# Patient Record
Sex: Female | Born: 1980 | Race: Black or African American | Hispanic: No | Marital: Single | Smoking: Never smoker
Health system: Southern US, Community
[De-identification: ages and names within clinical notes are randomized; demographics above are authoritative.]

## PROBLEM LIST (undated history)

## (undated) DIAGNOSIS — F419 Anxiety disorder, unspecified: Secondary | ICD-10-CM

## (undated) DIAGNOSIS — F329 Major depressive disorder, single episode, unspecified: Secondary | ICD-10-CM

## (undated) DIAGNOSIS — O1495 Unspecified pre-eclampsia, complicating the puerperium: Secondary | ICD-10-CM

## (undated) DIAGNOSIS — F32A Depression, unspecified: Secondary | ICD-10-CM

## (undated) HISTORY — PX: COLONOSCOPY: SHX174

## (undated) HISTORY — DX: Depression, unspecified: F32.A

## (undated) HISTORY — DX: Major depressive disorder, single episode, unspecified: F32.9

## (undated) HISTORY — DX: Anxiety disorder, unspecified: F41.9

## (undated) HISTORY — DX: Unspecified pre-eclampsia, complicating the puerperium: O14.95

---

## 2000-02-04 ENCOUNTER — Other Ambulatory Visit: Admission: RE | Admit: 2000-02-04 | Discharge: 2000-02-04 | Payer: Self-pay | Admitting: Gynecology

## 2000-04-28 ENCOUNTER — Inpatient Hospital Stay (HOSPITAL_COMMUNITY): Admission: AD | Admit: 2000-04-28 | Discharge: 2000-04-28 | Payer: Self-pay | Admitting: Obstetrics

## 2000-08-02 ENCOUNTER — Emergency Department (HOSPITAL_COMMUNITY): Admission: EM | Admit: 2000-08-02 | Discharge: 2000-08-02 | Payer: Self-pay | Admitting: Emergency Medicine

## 2000-10-01 ENCOUNTER — Encounter: Payer: Self-pay | Admitting: *Deleted

## 2000-10-01 ENCOUNTER — Emergency Department (HOSPITAL_COMMUNITY): Admission: EM | Admit: 2000-10-01 | Discharge: 2000-10-01 | Payer: Self-pay | Admitting: Family Medicine

## 2000-10-27 ENCOUNTER — Inpatient Hospital Stay (HOSPITAL_COMMUNITY): Admission: AD | Admit: 2000-10-27 | Discharge: 2000-10-27 | Payer: Self-pay | Admitting: Obstetrics & Gynecology

## 2000-10-27 ENCOUNTER — Inpatient Hospital Stay (HOSPITAL_COMMUNITY): Admission: AD | Admit: 2000-10-27 | Discharge: 2000-10-27 | Payer: Self-pay | Admitting: *Deleted

## 2000-10-29 ENCOUNTER — Inpatient Hospital Stay (HOSPITAL_COMMUNITY): Admission: AD | Admit: 2000-10-29 | Discharge: 2000-10-29 | Payer: Self-pay | Admitting: Obstetrics

## 2001-04-18 ENCOUNTER — Inpatient Hospital Stay (HOSPITAL_COMMUNITY): Admission: AD | Admit: 2001-04-18 | Discharge: 2001-04-18 | Payer: Self-pay | Admitting: *Deleted

## 2001-04-19 ENCOUNTER — Encounter: Payer: Self-pay | Admitting: *Deleted

## 2001-04-25 ENCOUNTER — Inpatient Hospital Stay (HOSPITAL_COMMUNITY): Admission: AD | Admit: 2001-04-25 | Discharge: 2001-04-25 | Payer: Self-pay | Admitting: *Deleted

## 2001-12-16 ENCOUNTER — Inpatient Hospital Stay (HOSPITAL_COMMUNITY): Admission: AD | Admit: 2001-12-16 | Discharge: 2001-12-16 | Payer: Self-pay | Admitting: Obstetrics

## 2001-12-18 ENCOUNTER — Inpatient Hospital Stay (HOSPITAL_COMMUNITY): Admission: AD | Admit: 2001-12-18 | Discharge: 2001-12-18 | Payer: Self-pay | Admitting: Obstetrics & Gynecology

## 2001-12-22 ENCOUNTER — Encounter: Payer: Self-pay | Admitting: *Deleted

## 2001-12-22 ENCOUNTER — Observation Stay (HOSPITAL_COMMUNITY): Admission: AD | Admit: 2001-12-22 | Discharge: 2001-12-23 | Payer: Self-pay | Admitting: *Deleted

## 2001-12-29 ENCOUNTER — Observation Stay (HOSPITAL_COMMUNITY): Admission: AD | Admit: 2001-12-29 | Discharge: 2001-12-30 | Payer: Self-pay | Admitting: *Deleted

## 2002-01-05 ENCOUNTER — Inpatient Hospital Stay (HOSPITAL_COMMUNITY): Admission: AD | Admit: 2002-01-05 | Discharge: 2002-01-07 | Payer: Self-pay | Admitting: *Deleted

## 2002-01-12 ENCOUNTER — Inpatient Hospital Stay (HOSPITAL_COMMUNITY): Admission: AD | Admit: 2002-01-12 | Discharge: 2002-01-14 | Payer: Self-pay | Admitting: *Deleted

## 2002-01-21 ENCOUNTER — Encounter: Admission: RE | Admit: 2002-01-21 | Discharge: 2002-01-21 | Payer: Self-pay | Admitting: Family Medicine

## 2002-03-02 ENCOUNTER — Encounter: Admission: RE | Admit: 2002-03-02 | Discharge: 2002-03-02 | Payer: Self-pay | Admitting: Family Medicine

## 2002-03-17 ENCOUNTER — Ambulatory Visit (HOSPITAL_COMMUNITY): Admission: RE | Admit: 2002-03-17 | Discharge: 2002-03-17 | Payer: Self-pay | Admitting: Internal Medicine

## 2002-04-05 ENCOUNTER — Encounter: Admission: RE | Admit: 2002-04-05 | Discharge: 2002-04-05 | Payer: Self-pay | Admitting: Family Medicine

## 2002-05-06 ENCOUNTER — Encounter: Admission: RE | Admit: 2002-05-06 | Discharge: 2002-05-06 | Payer: Self-pay | Admitting: Family Medicine

## 2002-05-31 ENCOUNTER — Encounter: Admission: RE | Admit: 2002-05-31 | Discharge: 2002-05-31 | Payer: Self-pay | Admitting: Family Medicine

## 2002-06-09 ENCOUNTER — Encounter: Admission: RE | Admit: 2002-06-09 | Discharge: 2002-06-09 | Payer: Self-pay | Admitting: Family Medicine

## 2002-06-29 ENCOUNTER — Encounter: Admission: RE | Admit: 2002-06-29 | Discharge: 2002-06-29 | Payer: Self-pay | Admitting: Sports Medicine

## 2002-07-12 ENCOUNTER — Inpatient Hospital Stay (HOSPITAL_COMMUNITY): Admission: AD | Admit: 2002-07-12 | Discharge: 2002-07-12 | Payer: Self-pay | Admitting: Family Medicine

## 2002-07-16 ENCOUNTER — Encounter: Admission: RE | Admit: 2002-07-16 | Discharge: 2002-07-16 | Payer: Self-pay | Admitting: Family Medicine

## 2002-07-29 ENCOUNTER — Encounter: Admission: RE | Admit: 2002-07-29 | Discharge: 2002-07-29 | Payer: Self-pay | Admitting: Family Medicine

## 2002-08-10 ENCOUNTER — Encounter: Admission: RE | Admit: 2002-08-10 | Discharge: 2002-08-10 | Payer: Self-pay | Admitting: Sports Medicine

## 2002-08-17 ENCOUNTER — Inpatient Hospital Stay (HOSPITAL_COMMUNITY): Admission: AD | Admit: 2002-08-17 | Discharge: 2002-08-20 | Payer: Self-pay | Admitting: *Deleted

## 2002-08-23 ENCOUNTER — Encounter: Admission: RE | Admit: 2002-08-23 | Discharge: 2002-08-23 | Payer: Self-pay | Admitting: Sports Medicine

## 2002-10-04 ENCOUNTER — Other Ambulatory Visit: Admission: RE | Admit: 2002-10-04 | Discharge: 2002-10-04 | Payer: Self-pay | Admitting: Family Medicine

## 2002-10-04 ENCOUNTER — Encounter: Admission: RE | Admit: 2002-10-04 | Discharge: 2002-10-04 | Payer: Self-pay | Admitting: Family Medicine

## 2002-12-27 ENCOUNTER — Encounter: Payer: Self-pay | Admitting: Emergency Medicine

## 2002-12-27 ENCOUNTER — Emergency Department (HOSPITAL_COMMUNITY): Admission: EM | Admit: 2002-12-27 | Discharge: 2002-12-27 | Payer: Self-pay | Admitting: Emergency Medicine

## 2003-05-26 ENCOUNTER — Encounter: Admission: RE | Admit: 2003-05-26 | Discharge: 2003-05-26 | Payer: Self-pay | Admitting: Sports Medicine

## 2003-05-31 ENCOUNTER — Encounter: Admission: RE | Admit: 2003-05-31 | Discharge: 2003-05-31 | Payer: Self-pay | Admitting: Sports Medicine

## 2003-05-31 ENCOUNTER — Encounter: Payer: Self-pay | Admitting: Sports Medicine

## 2003-05-31 ENCOUNTER — Encounter: Admission: RE | Admit: 2003-05-31 | Discharge: 2003-05-31 | Payer: Self-pay | Admitting: Family Medicine

## 2003-08-16 ENCOUNTER — Encounter: Admission: RE | Admit: 2003-08-16 | Discharge: 2003-08-16 | Payer: Self-pay | Admitting: Family Medicine

## 2003-10-07 ENCOUNTER — Other Ambulatory Visit: Admission: RE | Admit: 2003-10-07 | Discharge: 2003-10-07 | Payer: Self-pay | Admitting: Internal Medicine

## 2003-10-07 ENCOUNTER — Encounter: Admission: RE | Admit: 2003-10-07 | Discharge: 2003-10-07 | Payer: Self-pay | Admitting: Family Medicine

## 2003-11-03 ENCOUNTER — Encounter: Admission: RE | Admit: 2003-11-03 | Discharge: 2003-11-03 | Payer: Self-pay | Admitting: Family Medicine

## 2003-12-07 ENCOUNTER — Encounter: Admission: RE | Admit: 2003-12-07 | Discharge: 2003-12-07 | Payer: Self-pay | Admitting: Sports Medicine

## 2004-01-24 ENCOUNTER — Encounter: Admission: RE | Admit: 2004-01-24 | Discharge: 2004-01-24 | Payer: Self-pay | Admitting: Family Medicine

## 2004-05-09 ENCOUNTER — Encounter: Admission: RE | Admit: 2004-05-09 | Discharge: 2004-05-09 | Payer: Self-pay | Admitting: Family Medicine

## 2004-08-21 ENCOUNTER — Emergency Department (HOSPITAL_COMMUNITY): Admission: EM | Admit: 2004-08-21 | Discharge: 2004-08-21 | Payer: Self-pay | Admitting: Emergency Medicine

## 2004-09-20 ENCOUNTER — Ambulatory Visit: Payer: Self-pay | Admitting: Family Medicine

## 2004-12-11 ENCOUNTER — Ambulatory Visit: Payer: Self-pay | Admitting: Family Medicine

## 2005-02-27 ENCOUNTER — Ambulatory Visit: Payer: Self-pay | Admitting: Family Medicine

## 2005-06-06 ENCOUNTER — Ambulatory Visit: Payer: Self-pay | Admitting: Family Medicine

## 2005-09-25 ENCOUNTER — Encounter (INDEPENDENT_AMBULATORY_CARE_PROVIDER_SITE_OTHER): Payer: Self-pay | Admitting: *Deleted

## 2005-10-23 ENCOUNTER — Ambulatory Visit: Payer: Self-pay | Admitting: Family Medicine

## 2006-01-09 ENCOUNTER — Ambulatory Visit: Payer: Self-pay | Admitting: Family Medicine

## 2006-07-02 ENCOUNTER — Emergency Department (HOSPITAL_COMMUNITY): Admission: EM | Admit: 2006-07-02 | Discharge: 2006-07-02 | Payer: Self-pay | Admitting: Family Medicine

## 2007-01-23 ENCOUNTER — Encounter (INDEPENDENT_AMBULATORY_CARE_PROVIDER_SITE_OTHER): Payer: Self-pay | Admitting: *Deleted

## 2007-04-26 ENCOUNTER — Emergency Department (HOSPITAL_COMMUNITY): Admission: EM | Admit: 2007-04-26 | Discharge: 2007-04-26 | Payer: Self-pay | Admitting: Family Medicine

## 2007-04-30 ENCOUNTER — Ambulatory Visit (HOSPITAL_COMMUNITY): Admission: RE | Admit: 2007-04-30 | Discharge: 2007-04-30 | Payer: Self-pay | Admitting: Family Medicine

## 2007-05-05 ENCOUNTER — Encounter (INDEPENDENT_AMBULATORY_CARE_PROVIDER_SITE_OTHER): Payer: Self-pay | Admitting: Family Medicine

## 2007-05-05 ENCOUNTER — Ambulatory Visit: Payer: Self-pay | Admitting: Sports Medicine

## 2007-05-05 DIAGNOSIS — N760 Acute vaginitis: Secondary | ICD-10-CM | POA: Insufficient documentation

## 2007-05-05 LAB — CONVERTED CEMR LAB: Whiff Test: POSITIVE

## 2007-05-06 ENCOUNTER — Encounter (INDEPENDENT_AMBULATORY_CARE_PROVIDER_SITE_OTHER): Payer: Self-pay | Admitting: Family Medicine

## 2007-05-06 LAB — CONVERTED CEMR LAB: GC Probe Amp, Genital: NEGATIVE

## 2007-05-09 ENCOUNTER — Encounter (INDEPENDENT_AMBULATORY_CARE_PROVIDER_SITE_OTHER): Payer: Self-pay | Admitting: Family Medicine

## 2007-05-18 ENCOUNTER — Telehealth: Payer: Self-pay | Admitting: *Deleted

## 2007-10-09 ENCOUNTER — Emergency Department (HOSPITAL_COMMUNITY): Admission: EM | Admit: 2007-10-09 | Discharge: 2007-10-09 | Payer: Self-pay | Admitting: Emergency Medicine

## 2007-10-11 ENCOUNTER — Emergency Department (HOSPITAL_COMMUNITY): Admission: EM | Admit: 2007-10-11 | Discharge: 2007-10-11 | Payer: Self-pay | Admitting: Emergency Medicine

## 2007-10-13 ENCOUNTER — Encounter: Admission: RE | Admit: 2007-10-13 | Discharge: 2007-10-22 | Payer: Self-pay | Admitting: Orthopedic Surgery

## 2007-11-03 ENCOUNTER — Encounter: Payer: Self-pay | Admitting: Family Medicine

## 2007-11-03 ENCOUNTER — Encounter (INDEPENDENT_AMBULATORY_CARE_PROVIDER_SITE_OTHER): Payer: Self-pay | Admitting: Family Medicine

## 2007-11-03 ENCOUNTER — Other Ambulatory Visit: Admission: RE | Admit: 2007-11-03 | Discharge: 2007-11-03 | Payer: Self-pay | Admitting: Family Medicine

## 2007-11-03 ENCOUNTER — Ambulatory Visit: Payer: Self-pay | Admitting: Family Medicine

## 2007-11-03 LAB — CONVERTED CEMR LAB
Basophils Absolute: 0 10*3/uL (ref 0.0–0.1)
GC Probe Amp, Genital: NEGATIVE
Hemoglobin: 12.2 g/dL (ref 12.0–15.0)
Hepatitis B Surface Ag: NEGATIVE
Lymphocytes Relative: 46 % (ref 12–46)
Lymphs Abs: 2.1 10*3/uL (ref 0.7–4.0)
MCV: 90.5 fL (ref 78.0–100.0)
Neutro Abs: 2.1 10*3/uL (ref 1.7–7.7)
Pap Smear: NORMAL
Platelets: 213 10*3/uL (ref 150–400)
RBC: 4.12 M/uL (ref 3.87–5.11)
RDW: 13.7 % (ref 11.5–15.5)
Rh Type: POSITIVE
Rubella: 53.4 intl units/mL — ABNORMAL HIGH

## 2007-11-04 ENCOUNTER — Encounter: Payer: Self-pay | Admitting: Family Medicine

## 2007-11-05 ENCOUNTER — Encounter: Payer: Self-pay | Admitting: Family Medicine

## 2007-11-05 ENCOUNTER — Ambulatory Visit: Payer: Self-pay | Admitting: Family Medicine

## 2007-11-05 LAB — CONVERTED CEMR LAB: hCG, Beta Chain, Quant, S: 4016.7 milliintl units/mL

## 2007-11-09 ENCOUNTER — Telehealth: Payer: Self-pay | Admitting: Family Medicine

## 2007-11-13 ENCOUNTER — Inpatient Hospital Stay (HOSPITAL_COMMUNITY): Admission: AD | Admit: 2007-11-13 | Discharge: 2007-11-13 | Payer: Self-pay | Admitting: Obstetrics and Gynecology

## 2007-11-21 ENCOUNTER — Inpatient Hospital Stay (HOSPITAL_COMMUNITY): Admission: AD | Admit: 2007-11-21 | Discharge: 2007-11-21 | Payer: Self-pay | Admitting: Gynecology

## 2007-11-30 ENCOUNTER — Encounter: Payer: Self-pay | Admitting: Family Medicine

## 2007-11-30 ENCOUNTER — Ambulatory Visit: Payer: Self-pay | Admitting: Family Medicine

## 2007-11-30 ENCOUNTER — Observation Stay (HOSPITAL_COMMUNITY): Admission: AD | Admit: 2007-11-30 | Discharge: 2007-11-30 | Payer: Self-pay | Admitting: Family Medicine

## 2007-11-30 LAB — CONVERTED CEMR LAB
Glucose, Urine, Semiquant: NEGATIVE
Protein, U semiquant: NEGATIVE

## 2007-12-18 ENCOUNTER — Inpatient Hospital Stay (HOSPITAL_COMMUNITY): Admission: AD | Admit: 2007-12-18 | Discharge: 2007-12-18 | Payer: Self-pay | Admitting: Obstetrics & Gynecology

## 2007-12-21 ENCOUNTER — Encounter: Payer: Self-pay | Admitting: Family Medicine

## 2007-12-24 ENCOUNTER — Inpatient Hospital Stay (HOSPITAL_COMMUNITY): Admission: AD | Admit: 2007-12-24 | Discharge: 2007-12-27 | Payer: Self-pay | Admitting: Obstetrics & Gynecology

## 2008-01-24 ENCOUNTER — Inpatient Hospital Stay (HOSPITAL_COMMUNITY): Admission: AD | Admit: 2008-01-24 | Discharge: 2008-01-24 | Payer: Self-pay | Admitting: Obstetrics

## 2008-01-27 ENCOUNTER — Inpatient Hospital Stay (HOSPITAL_COMMUNITY): Admission: AD | Admit: 2008-01-27 | Discharge: 2008-01-28 | Payer: Self-pay | Admitting: Obstetrics & Gynecology

## 2008-02-03 ENCOUNTER — Ambulatory Visit (HOSPITAL_COMMUNITY): Admission: RE | Admit: 2008-02-03 | Discharge: 2008-02-03 | Payer: Self-pay | Admitting: Obstetrics

## 2008-04-10 ENCOUNTER — Inpatient Hospital Stay (HOSPITAL_COMMUNITY): Admission: AD | Admit: 2008-04-10 | Discharge: 2008-04-10 | Payer: Self-pay | Admitting: Obstetrics

## 2008-06-17 ENCOUNTER — Inpatient Hospital Stay (HOSPITAL_COMMUNITY): Admission: AD | Admit: 2008-06-17 | Discharge: 2008-06-17 | Payer: Self-pay | Admitting: Psychiatry

## 2008-06-20 ENCOUNTER — Inpatient Hospital Stay (HOSPITAL_COMMUNITY): Admission: AD | Admit: 2008-06-20 | Discharge: 2008-06-20 | Payer: Self-pay | Admitting: Psychiatry

## 2008-06-23 ENCOUNTER — Inpatient Hospital Stay (HOSPITAL_COMMUNITY): Admission: AD | Admit: 2008-06-23 | Discharge: 2008-06-26 | Payer: Self-pay | Admitting: Obstetrics

## 2008-06-29 ENCOUNTER — Inpatient Hospital Stay (HOSPITAL_COMMUNITY): Admission: AD | Admit: 2008-06-29 | Discharge: 2008-07-03 | Payer: Self-pay | Admitting: Obstetrics

## 2008-06-29 ENCOUNTER — Ambulatory Visit: Payer: Self-pay | Admitting: Cardiology

## 2008-07-01 ENCOUNTER — Encounter: Payer: Self-pay | Admitting: Obstetrics & Gynecology

## 2009-05-24 ENCOUNTER — Encounter: Payer: Self-pay | Admitting: Family Medicine
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
## 2009-09-21 ENCOUNTER — Ambulatory Visit: Payer: Self-pay | Admitting: Family Medicine

## 2009-09-21 ENCOUNTER — Encounter: Payer: Self-pay | Admitting: Family Medicine

## 2009-09-21 ENCOUNTER — Telehealth: Payer: Self-pay | Admitting: Family Medicine

## 2009-09-21 ENCOUNTER — Other Ambulatory Visit: Admission: RE | Admit: 2009-09-21 | Discharge: 2009-09-21 | Payer: Self-pay | Admitting: Family Medicine

## 2009-09-21 LAB — CONVERTED CEMR LAB
Chlamydia, DNA Probe: NEGATIVE
HCV Ab: NEGATIVE

## 2009-09-22 ENCOUNTER — Encounter: Payer: Self-pay | Admitting: Family Medicine

## 2011-01-13 ENCOUNTER — Encounter: Payer: Self-pay | Admitting: *Deleted

## 2011-02-05 ENCOUNTER — Inpatient Hospital Stay (HOSPITAL_COMMUNITY)
Admission: AD | Admit: 2011-02-05 | Discharge: 2011-02-05 | Disposition: A | Discharge status: Home / Self Care | Payer: Self-pay | Source: Ambulatory Visit | Attending: Obstetrics & Gynecology | Admitting: Obstetrics & Gynecology

## 2011-02-05 ENCOUNTER — Inpatient Hospital Stay (HOSPITAL_COMMUNITY): Payer: Self-pay

## 2011-02-05 DIAGNOSIS — R52 Pain, unspecified: Secondary | ICD-10-CM

## 2011-02-05 DIAGNOSIS — O209 Hemorrhage in early pregnancy, unspecified: Secondary | ICD-10-CM

## 2011-02-05 LAB — URINALYSIS, ROUTINE W REFLEX MICROSCOPIC
Glucose, UA: NEGATIVE mg/dL
Ketones, ur: NEGATIVE mg/dL
Leukocytes, UA: NEGATIVE
Nitrite: NEGATIVE
pH: 5.5 (ref 5.0–8.0)

## 2011-02-05 LAB — CBC
HCT: 38.6 % (ref 36.0–46.0)
MCHC: 33.2 g/dL (ref 30.0–36.0)
MCV: 87.7 fL (ref 78.0–100.0)
Platelets: 216 10*3/uL (ref 150–400)
RDW: 12.7 % (ref 11.5–15.5)

## 2011-02-05 LAB — URINE MICROSCOPIC-ADD ON

## 2011-02-05 LAB — POCT PREGNANCY, URINE

## 2011-02-05 LAB — WET PREP, GENITAL: Trich, Wet Prep: NONE SEEN

## 2011-02-06 LAB — GC/CHLAMYDIA PROBE AMP, GENITAL: Chlamydia, DNA Probe: NEGATIVE

## 2011-02-07 ENCOUNTER — Inpatient Hospital Stay (HOSPITAL_COMMUNITY)
Admission: AD | Admit: 2011-02-07 | Discharge: 2011-02-07 | Disposition: A | Discharge status: Home / Self Care | Payer: Self-pay | Source: Ambulatory Visit | Attending: Family Medicine | Admitting: Family Medicine

## 2011-02-07 DIAGNOSIS — O3680X0 Pregnancy with inconclusive fetal viability, not applicable or unspecified: Secondary | ICD-10-CM

## 2011-02-07 DIAGNOSIS — O209 Hemorrhage in early pregnancy, unspecified: Secondary | ICD-10-CM | POA: Insufficient documentation

## 2011-02-07 LAB — HCG, QUANTITATIVE, PREGNANCY: hCG, Beta Chain, Quant, S: 517 m[IU]/mL — ABNORMAL HIGH (ref <5)

## 2011-02-14 ENCOUNTER — Ambulatory Visit (HOSPITAL_COMMUNITY)
Admit: 2011-02-14 | Discharge: 2011-02-14 | Disposition: A | Discharge status: Home / Self Care | Payer: Self-pay | Attending: Family Medicine | Admitting: Family Medicine

## 2011-02-14 ENCOUNTER — Inpatient Hospital Stay (HOSPITAL_COMMUNITY)
Admission: AD | Admit: 2011-02-14 | Discharge: 2011-02-14 | Disposition: A | Discharge status: Home / Self Care | Payer: Self-pay | Source: Ambulatory Visit | Attending: Obstetrics and Gynecology | Admitting: Obstetrics and Gynecology

## 2011-02-14 DIAGNOSIS — R109 Unspecified abdominal pain: Secondary | ICD-10-CM | POA: Insufficient documentation

## 2011-02-14 DIAGNOSIS — O99891 Other specified diseases and conditions complicating pregnancy: Secondary | ICD-10-CM | POA: Insufficient documentation

## 2011-02-14 DIAGNOSIS — O209 Hemorrhage in early pregnancy, unspecified: Secondary | ICD-10-CM | POA: Insufficient documentation

## 2011-02-14 DIAGNOSIS — O3680X0 Pregnancy with inconclusive fetal viability, not applicable or unspecified: Secondary | ICD-10-CM

## 2011-02-14 DIAGNOSIS — Z3689 Encounter for other specified antenatal screening: Secondary | ICD-10-CM | POA: Insufficient documentation

## 2011-02-14 LAB — HCG, QUANTITATIVE, PREGNANCY: hCG, Beta Chain, Quant, S: 10319 m[IU]/mL — ABNORMAL HIGH (ref <5)

## 2011-02-25 ENCOUNTER — Ambulatory Visit (HOSPITAL_COMMUNITY): Payer: Self-pay | Attending: Obstetrics and Gynecology

## 2011-04-12 NOTE — Discharge Summary (Signed)
NAME:  Leah Dominguez, Leah Dominguez               ACCOUNT NO.:  [id number]   MEDICAL RECORD NO.:  [id number]          PATIENT TYPE:  INP   LOCATION:  9307                          FACILITY:  [facility]   PHYSICIAN:  Roseanna Rainbow, M.D.DATE OF BIRTH:  12-30-1980   DATE OF ADMISSION:  12/24/2007  DATE OF DISCHARGE:  12/27/2007                               DISCHARGE SUMMARY   CHIEF COMPLAINT:  The patient is a 30 year old para 1, with an estimated  date of confinement of July 03, 2008, by last menstrual period,  complaining of nausea and vomiting.   HISTORY OF PRESENT ILLNESS:  She had presented to [facility]  several weeks ago with similar complaints.  She was started on Zofran.  Her nausea and vomiting was controlled with the Zofran; however, the  patient did not have her prescription refilled.   PAST SURGICAL HISTORY:  She denies.   PAST MEDICAL HISTORY:  She denies.   MEDICATIONS:  1. Zofran.  2. Tylenol.   ALLERGIES:  No known drug allergies.   SOCIAL HISTORY:  She is single.  She denies any tobacco, ethanol, or  drug use.   PHYSICAL EXAMINATION:  VITAL SIGNS:  Stable and afebrile.  LUNGS:  Clear to auscultation bilaterally.  HEART:  Regular rate and rhythm.  ABDOMEN:  Nontender.   ASSESSMENT AND PLAN:  Intrauterine pregnancy at 25-13 weeks' gestation  with hyperemesis gravidarum.   PLAN:  1. Admission.  2. IV hydration.  3. Supportive management.   HOSPITAL COURSE:  The patient was admitted.  She was given Zofran.  Her  symptoms improved.  She was tolerating a regular diet, and she was  discharged to home on December 27, 2007.   DISCHARGE DIAGNOSES:  1. Intrauterine pregnancy, early second trimester.  2. Hyperemesis gravidarum.   CONDITION:  Stable.   DIET:  Regular bland diet.   ACTIVITY:  Modified bedrest.   MEDICATIONS:  Included Zofran.   DISPOSITION:  The patient was to follow up in the office in 1 week.      Roseanna Rainbow, M.D.  Electronically Signed     LAJ/MEDQ  D:  02/26/2008  T:  02/27/2008  Job:  [id number]

## 2011-04-12 NOTE — Discharge Summary (Signed)
[facility]  Patient:    ABIHA, LUKEHART Visit Number: [id number] MRN: [id number]          Service Type: Attending:  Conni Elliot, M.D. Dictated by:   Ed Blalock. Burnadette Peter, M.D. Adm. Date:  01/05/02 Disc. Date: 01/07/02                             Discharge Summary  HISTORY OF PRESENT ILLNESS:  The patient is a 30 year old, G5, P4, who at 8-1/7 weeks who was seen numerous times prior to admission with nausea and vomiting.  She has been vomiting since the early day of her admission, has used p.o. Phenergan without relief.  She had Phenergan suppositories, but she did not use these.  She has vomited several times while in maternity admissions units.  MEDICATIONS:  Prenatal vitamins.  ALLERGIES:  No known drug allergies.  PAST OBSTETRICAL HISTORY: 1. TAB x3. 2. SAB x1.  PAST GYNECOLOGICAL HISTORY:  History of chlamydia.  PAST MEDICAL HISTORY:  Denies any prior problems or surgeries.  SOCIAL HISTORY:  Denies tobacco, alcohol, or drug use.  No hospitalizations.  PHYSICAL EXAMINATION:  VITAL SIGNS:  Stable.  GENERAL:  Well-developed, well-nourished black female who appears ill.  HEENT:  Normocephalic, oropharynx was mildly dry.  EXTREMITIES:  Good range of motion, no edema.  SKIN:  Warm and dry.  LABORATORY DATA:  UA shows 80 ketones, on subsequent repeat labs as well.  HOSPITAL COURSE:  The patient was admitted initially overnight for IV rehydration.  She after day #1, still continued to have ketones and some nausea.  She was kept for a further day on IV fluids and Phenergan.  She eventually improved, and was able to tolerate p.o.  Ketonuria improved, and she was subsequently discharged home.  ACTIVITY:  Ad lib.  DIET:  Clear liquids, advance as tolerated.  DISCHARGE MEDICATIONS: 1. B6. 2. ______.  FOLLOWUP: 1. She is to be seen in the MAU approximately 5 days after discharge for    electrolytes check. 2. She is to follow  up [facility] in one week.  CONDITION ON DISCHARGE:  Improved.  DISCHARGE DIAGNOSES: 1. Intrauterine pregnancy at 8 weeks. 2. Hyperemesis, improved. Dictated by:   Ed Blalock. Burnadette Peter, M.D. Attending:  Conni Elliot, M.D. DD:  03/02/02 TD:  03/02/02 Job: [id number]

## 2011-04-12 NOTE — Discharge Summary (Signed)
[facility]  Patient:    Leah Dominguez, Leah Dominguez Visit Number: [id number] MRN: [id number]          Service Type: Attending:  Denton Brick M.D. Dictated by:   Vear Clock, M.D. Adm. Date:  01/12/02 Disc. Date: 01/14/02   CC:         [facility], Attn: Dr. Ophelia Charter   Discharge Summary  DISCHARGE DIAGNOSES:          1. Pregnancy.                               2. Hyperemesis.  DISCHARGE MEDICATIONS:        1. Vitamin B6 50 mg p.o. b.i.d.                               2. Unisom 12.5 mg 1/2 tab p.o. q.a.m. and                                  q.noon, 1 tab p.o. q.h.s.                               3. Phenergan, take as directed previously.                               4. Prenatal vitamins one p.o. q.d.  FOLLOW-UP:                    The patient is to follow up with Dr. Jonah Blue at [facility] on Thursday, January 21, 2002 at 3 p.m.  PROCEDURES AND DIAGNOSTIC STUDIES:                      None.  CONSULTANTS:                  None.  ADMISSION HISTORY AND PHYSICAL:                     [name] is a 30 year old, G5, P0-0-4-0 at 65 and 4 weeks who presented with hyperemesis on January 11, 2002.  The patient was admitted and this was her second admission for hyperemesis during this pregnancy.  HOSPITAL COURSE:  #1 - HYPEREMESIS:  The patient was started on IV fluids and encouraged to take p.o. and cleared for urine ketones.  She was started on doxylamine and vitamin B6 with p.r.n. Phenergan and eventually was able to tolerate p.o. again.  The patient was discharged to home with [facility] ordered to do q.d. visits to assess hydration status and urine ketones times one week.  She was discharged with B6, Unisom and Phenergan.  She did clear her nausea and vomiting and was able to tolerate p.o. on the day of discharge, which was January 14, 2002.  DISCHARGE LABORATORY DATA:    Sodium 138,  potassium 3.7, chloride 107, CO2 22, glucose 91, BUN 3, creatinine 0.5, calcium 8.5.  Urine with trace ketones.  UA on admission shows trace hemoglobin, greater than 80 ketones, trace LE but was normal at discharge.  Urine culture showed greater than 100,000 colonies of multiple species consistent with contamination.  The patient was discharged home without further incident. Dictated by:   Vear Clock, M.D. Attending:  Denton Brick M.D. DD:  03/25/02 TD:  03/29/02 Job: [id number]

## 2011-04-12 NOTE — Discharge Summary (Signed)
NAME:  Leah Dominguez, Leah Dominguez               ACCOUNT NO.:  [id number]   MEDICAL RECORD NO.:  [id number]          PATIENT TYPE:  INP   LOCATION:  9320                          FACILITY:  [facility]   PHYSICIAN:  Charles A. Clearance Coots, M.D.DATE OF BIRTH:  1981/05/07   DATE OF ADMISSION:  06/29/2008  DATE OF DISCHARGE:  07/03/2008                               DISCHARGE SUMMARY   ADMITTING DIAGNOSIS:  Postpartum preeclampsia.   DISCHARGE DIAGNOSES:  Postpartum preeclampsia improved after IV  magnesium sulfate, prophylaxis for seizure management, and  antihypertensive therapy.   The patient discharged home much improved in good condition.   REASON FOR ADMISSION:  A 30 year old black female postpartum status post  normal spontaneous vaginal delivery on June 24, 2008, presents with  shortness of breath and chest pain and swelling of her feet.   PAST MEDICAL HISTORY/SURGERY:  None.   ILLNESSES:  None.   MEDICATIONS:  Prenatal vitamins.   ALLERGIES:  LATEX.   SOCIAL HISTORY:  Single.  Negative tobacco, alcohol, or recreational  drug use.   PHYSICAL EXAMINATION:  VITAL SIGNS:  She is afebrile, blood pressure  170/106.  LUNGS:  Clear to auscultation bilaterally.  HEART:  Regular rate and rhythm.  ABDOMEN:  Soft and nontender.   ADMITTING LABORATORY DATA:  Hemoglobin 10, hematocrit 31, white blood  cell count 5400, and platelets 207,000.  Basic metabolic panel was  within normal limits.  SGOT was 41.  Uric acid was 5.  LDH was 149.   HOSPITAL COURSE:  The patient was admitted and started on magnesium  sulfate seizure prophylaxis.  Chest x-ray revealed mild pulmonary edema  changes.  The patient was given Lasix IV and responded quite well with  good diuresis.  Labetalol was also given for blood pressure management  and she responded well to labetalol.  Norvasc was added to her blood  pressure regimen, which was able to maintain good blood pressure control  on the combination of labetalol and Norvasc.   The patient maintain good  blood pressure control on Norvasc 10 mg p.o. daily and labetalol 200 mg  p.o. twice a day after magnesium sulfate was discontinued on hospital  day #2.  She was discharged home on hospital day #4 on the same  antihypertensive combination.   DISCHARGE DISPOSITION:  Medications, labetalol 200 mg p.o. twice a day,  Norvasc 10 mg p.o. daily.  Continue prenatal vitamins.  Routine written  instructions were given for discharge with preeclampsia precautions.  The patient is to call the office for a followup appointment in 1 week  for blood pressure check.      Charles A. Clearance Coots, M.D.  Electronically Signed     CAH/MEDQ  D:  07/22/2008  T:  07/23/2008  Job:  [id number]

## 2011-08-12 ENCOUNTER — Inpatient Hospital Stay (HOSPITAL_COMMUNITY)
Admission: AD | Admit: 2011-08-12 | Discharge: 2011-08-12 | Disposition: A | Discharge status: Home / Self Care | Payer: Self-pay | Source: Ambulatory Visit | Attending: Obstetrics & Gynecology | Admitting: Obstetrics & Gynecology

## 2011-08-12 ENCOUNTER — Encounter (HOSPITAL_COMMUNITY): Payer: Self-pay

## 2011-08-12 DIAGNOSIS — M545 Low back pain, unspecified: Secondary | ICD-10-CM | POA: Insufficient documentation

## 2011-08-12 DIAGNOSIS — S335XXA Sprain of ligaments of lumbar spine, initial encounter: Secondary | ICD-10-CM

## 2011-08-12 LAB — POCT PREGNANCY, URINE: Preg Test, Ur: POSITIVE

## 2011-08-12 LAB — URINALYSIS, ROUTINE W REFLEX MICROSCOPIC
Nitrite: NEGATIVE
Protein, ur: NEGATIVE mg/dL
Specific Gravity, Urine: >1.03 — ABNORMAL HIGH (ref 1.005–1.030)
Urobilinogen, UA: 1 mg/dL (ref 0.0–1.0)

## 2011-08-12 LAB — URINE MICROSCOPIC-ADD ON

## 2011-08-12 MED ORDER — COMPLETENATE 29-1 MG PO CHEW: 1.0000 | CHEWABLE_TABLET | Freq: Every day | ORAL | Status: DC

## 2011-08-12 NOTE — Progress Notes (Signed)
Pt states she has had lower back pain more on the right side since this am. Is taking BCP's but is afraid of being pregnant.

## 2011-08-14 LAB — URINALYSIS, ROUTINE W REFLEX MICROSCOPIC
Glucose, UA: NEGATIVE
Ketones, ur: 40 — AB
Nitrite: NEGATIVE
Specific Gravity, Urine: 1.025
pH: 6.5

## 2011-08-14 LAB — URINE MICROSCOPIC-ADD ON

## 2011-08-15 LAB — URINALYSIS, ROUTINE W REFLEX MICROSCOPIC
Bilirubin Urine: NEGATIVE
Bilirubin Urine: NEGATIVE
Glucose, UA: NEGATIVE
Hgb urine dipstick: NEGATIVE
Hgb urine dipstick: NEGATIVE
Ketones, ur: NEGATIVE
Nitrite: NEGATIVE
Protein, ur: NEGATIVE
Protein, ur: NEGATIVE
Specific Gravity, Urine: >1.03 — ABNORMAL HIGH
Urobilinogen, UA: 1
Urobilinogen, UA: 1

## 2011-08-15 LAB — CBC
MCHC: 35
MCV: 89.7
Platelets: 190
RDW: 12.9

## 2011-08-15 LAB — COMPREHENSIVE METABOLIC PANEL
ALT: <8
AST: 11
Albumin: 2.7 — ABNORMAL LOW
CO2: 21
Calcium: 8.8
Creatinine, Ser: 0.47
GFR calc Af Amer: >60
GFR calc non Af Amer: >60
Sodium: 133 — ABNORMAL LOW
Total Protein: 5.5 — ABNORMAL LOW

## 2011-08-19 ENCOUNTER — Inpatient Hospital Stay (HOSPITAL_COMMUNITY)
Admission: AD | Admit: 2011-08-19 | Discharge: 2011-08-19 | Disposition: A | Discharge status: Home / Self Care | Payer: Self-pay | Source: Ambulatory Visit | Attending: Obstetrics & Gynecology | Admitting: Obstetrics & Gynecology

## 2011-08-19 ENCOUNTER — Encounter (HOSPITAL_COMMUNITY): Payer: Self-pay | Admitting: *Deleted

## 2011-08-19 ENCOUNTER — Inpatient Hospital Stay (HOSPITAL_COMMUNITY): Payer: Self-pay

## 2011-08-19 DIAGNOSIS — O2390 Unspecified genitourinary tract infection in pregnancy, unspecified trimester: Secondary | ICD-10-CM | POA: Insufficient documentation

## 2011-08-19 DIAGNOSIS — N760 Acute vaginitis: Secondary | ICD-10-CM | POA: Insufficient documentation

## 2011-08-19 DIAGNOSIS — O209 Hemorrhage in early pregnancy, unspecified: Secondary | ICD-10-CM

## 2011-08-19 DIAGNOSIS — O30001 Twin pregnancy, unspecified number of placenta and unspecified number of amniotic sacs, first trimester: Secondary | ICD-10-CM

## 2011-08-19 DIAGNOSIS — R109 Unspecified abdominal pain: Secondary | ICD-10-CM | POA: Insufficient documentation

## 2011-08-19 DIAGNOSIS — B9689 Other specified bacterial agents as the cause of diseases classified elsewhere: Secondary | ICD-10-CM

## 2011-08-19 DIAGNOSIS — A499 Bacterial infection, unspecified: Secondary | ICD-10-CM

## 2011-08-19 DIAGNOSIS — O30009 Twin pregnancy, unspecified number of placenta and unspecified number of amniotic sacs, unspecified trimester: Secondary | ICD-10-CM | POA: Insufficient documentation

## 2011-08-19 LAB — CBC
HCT: 37
HCT: 37.2 % (ref 36.0–46.0)
Hemoglobin: 12.4 g/dL (ref 12.0–15.0)
Hemoglobin: 12.9
MCHC: 33.3 g/dL (ref 30.0–36.0)
MCV: 88.6 fL (ref 78.0–100.0)
MCV: 89.2
RBC: 4.15
WBC: 6.1 10*3/uL (ref 4.0–10.5)
WBC: 7.5

## 2011-08-19 LAB — URINALYSIS, ROUTINE W REFLEX MICROSCOPIC
Glucose, UA: NEGATIVE mg/dL
Ketones, ur: 15 mg/dL — AB
Ketones, ur: 15 — AB
Leukocytes, UA: NEGATIVE
Protein, ur: NEGATIVE
Urobilinogen, UA: 1
pH: 6 (ref 5.0–8.0)

## 2011-08-19 LAB — WET PREP, GENITAL
Trich, Wet Prep: NONE SEEN
Trich, Wet Prep: NONE SEEN
Yeast Wet Prep HPF POC: NONE SEEN

## 2011-08-19 LAB — COMPREHENSIVE METABOLIC PANEL
AST: 14
BUN: 6
CO2: 23
Chloride: 104
Creatinine, Ser: 0.38 — ABNORMAL LOW
GFR calc Af Amer: >60
GFR calc non Af Amer: >60
Total Bilirubin: 0.9

## 2011-08-19 LAB — GC/CHLAMYDIA PROBE AMP, GENITAL: GC Probe Amp, Genital: NEGATIVE

## 2011-08-19 LAB — ABO/RH: ABO/RH(D): B POS

## 2011-08-19 MED ORDER — ONDANSETRON 8 MG PO TBDP: 8.0000 mg | ORAL_TABLET | Freq: Three times a day (TID) | ORAL | Status: AC | PRN

## 2011-08-19 MED ORDER — METRONIDAZOLE 500 MG PO TABS: 500.0000 mg | ORAL_TABLET | Freq: Two times a day (BID) | ORAL | Status: AC

## 2011-08-19 MED ORDER — ONDANSETRON 8 MG PO TBDP
8.0000 mg | ORAL_TABLET | Freq: Once | ORAL | Status: AC
  Administered 2011-08-19: 8 mg via ORAL
  Filled 2011-08-19: qty 1

## 2011-08-19 NOTE — ED Provider Notes (Signed)
Attestation of Attending Supervision of Advanced Practitioner: Evaluation and management procedures were performed by the PA/NP/CNM/OB Fellow under my supervision/collaboration. Chart reviewed and agree with management and plan.  Mianna Iezzi A 08/19/2011 1:08 PM   

## 2011-08-19 NOTE — ED Provider Notes (Signed)
History     Chief Complaint  Patient presents with  . Abdominal Pain   HPITita T Robinson30 y.o. presents with right lower abdominal pain X1 week.  Nausea and vomiting daily 1-3 times.  + Pregnancy here last week, unable to stay for evaluation.  LMP 8/14 was using loestrin. G6 P2 032.  Reports vaginal bleeding lightly X 4 days.     Past Medical History  Diagnosis Date  . Hypertension     PIH with last pregnancy    Past Surgical History  Procedure Date  . No past surgeries     No family history on file.  History  Substance Use Topics  . Smoking status: Never Smoker   . Smokeless tobacco: Not on file  . Alcohol Use: No    Allergies: No Known Allergies  Prescriptions prior to admission  Medication Sig Dispense Refill  . acetaminophen (TYLENOL) 325 MG tablet Take 325 mg by mouth every 6 (six) hours as needed. For headache.       . prenatal vitamin w/FE, FA (NATACHEW) 29-1 MG CHEW Chew 1 tablet by mouth daily.  30 tablet  5    Review of Systems  Gastrointestinal: Positive for nausea, vomiting and abdominal pain.  Genitourinary:       Vaginal bleeding   Physical Exam   Blood pressure 118/79, pulse 78, temperature 99.2 F (37.3 C), temperature source Oral, resp. rate 20, height 5\' 11"  (1.803 m), weight 195 lb (88.451 kg), last menstrual period 07/09/2011.  Physical Exam  Constitutional: She is oriented to person, place, and time. She appears well-developed and well-nourished. No distress.  HENT:  Head: Normocephalic.  Neck: Normal range of motion.  Respiratory: Effort normal.  GI: Soft. There is tenderness. There is no rebound and no guarding.  Genitourinary: Uterus is enlarged. Uterus is not tender. Right adnexum displays tenderness (mild). Right adnexum displays no mass and no fullness. Left adnexum displays tenderness (mild). Left adnexum displays no mass and no fullness. There is bleeding (small amount with clots, thin red) around the vagina. No erythema around the  vagina. Vaginal discharge (frothy discharge) found.  Neurological: She is alert and oriented to person, place, and time.  Skin: Skin is warm and dry.   Results for orders placed during the hospital encounter of 08/19/11 (from the past 24 hour(s))  URINALYSIS, ROUTINE W REFLEX MICROSCOPIC     Status: Abnormal   Collection Time   08/19/11  9:01 AM      Component Value Range   Color, Urine YELLOW  YELLOW    Appearance CLEAR  CLEAR    Specific Gravity, Urine >1.030 (*) 1.005 - 1.030    pH 6.0  5.0 - 8.0    Glucose, UA NEGATIVE  NEGATIVE (mg/dL)   Hgb urine dipstick MODERATE (*) NEGATIVE    Bilirubin Urine SMALL (*) NEGATIVE    Ketones, ur 15 (*) NEGATIVE (mg/dL)   Protein, ur NEGATIVE  NEGATIVE (mg/dL)   Urobilinogen, UA 1.0  0.0 - 1.0 (mg/dL)   Nitrite NEGATIVE  NEGATIVE    Leukocytes, UA NEGATIVE  NEGATIVE   URINE MICROSCOPIC-ADD ON     Status: Abnormal   Collection Time   08/19/11  9:01 AM      Component Value Range   Squamous Epithelial / LPF FEW (*) RARE    WBC, UA 0-2  <3 (WBC/hpf)   RBC / HPF 0-2  <3 (RBC/hpf)   Urine-Other MUCOUS PRESENT    WET PREP, GENITAL  Status: Abnormal   Collection Time   08/19/11  9:35 AM      Component Value Range   Yeast, Wet Prep NONE SEEN  NONE SEEN    Trich, Wet Prep NONE SEEN  NONE SEEN    Clue Cells, Wet Prep MODERATE (*) NONE SEEN    WBC, Wet Prep HPF POC MODERATE (*) NONE SEEN   CBC     Status: Normal   Collection Time   08/19/11  9:46 AM      Component Value Range   WBC 6.1  4.0 - 10.5 (K/uL)   RBC 4.20  3.87 - 5.11 (MIL/uL)   Hemoglobin 12.4  12.0 - 15.0 (g/dL)   HCT 40.9  81.1 - 91.4 (%)   MCV 88.6  78.0 - 100.0 (fL)   MCH 29.5  26.0 - 34.0 (pg)   MCHC 33.3  30.0 - 36.0 (g/dL)   RDW 78.2  95.6 - 21.3 (%)   Platelets 228  150 - 400 (K/uL)  ABO/RH     Status: Normal   Collection Time   08/19/11  9:46 AM      Component Value Range   ABO/RH(D) B POS    HCG, QUANTITATIVE, PREGNANCY     Status: Abnormal   Collection Time    08/19/11  9:52 AM      Component Value Range   hCG, Beta Chain, Quant, S 08657 (*) <5 (mIU/mL)    ULTRASOUND: Living dichorionic twin IUP with [place] Gestational Age of [redacted]w[redacted]d, EDD 04/15/11.  Small SCH.  No adnexal mass or FF MAU Course  Procedures Pelvic exam, cultures to lab     Assessment and Plan  A; Abdominal pain in early pregnancy     Living dichorionic twins IUP [redacted]w[redacted]d     Bacterial Vaginosis  P:  Patient plans care with [facility]         Rx for Flagyl, Zofran       Instructed to avoid intercourse until bleeding stops. Explained Subchorionic hemorrhage to patient.    At time of discharge, patient asking for a Zofran.  Zofran 8mg  ODT ordered. Leah Dominguez 08/19/2011, 9:12 AM   Leah Holmes, NP 08/19/11 1132  Leah Holmes, NP 08/19/11 1154

## 2011-08-19 NOTE — Progress Notes (Signed)
Had pain for a while, comes and goes- more with movement.  Was here a wk ago for back pain.  Has had some bleeding and brownish d/c.  Bleeding first noted on Fri. Has been nauseated, tried to eat yesterday - unable to keep anything down.

## 2011-08-20 LAB — GC/CHLAMYDIA PROBE AMP, GENITAL
Chlamydia, DNA Probe: NEGATIVE
GC Probe Amp, Genital: NEGATIVE

## 2011-08-23 LAB — LACTATE DEHYDROGENASE: LDH: 147

## 2011-08-23 LAB — CBC
HCT: 31.2 — ABNORMAL LOW
Hemoglobin: 9.7 — ABNORMAL LOW
Hemoglobin: 10.1 — ABNORMAL LOW
Hemoglobin: 9.6 — ABNORMAL LOW
MCHC: 32.6
MCV: 82
MCV: 82.6
MCV: 82.6
RBC: 3.15 — ABNORMAL LOW
RBC: 3.55 — ABNORMAL LOW
RBC: 3.81 — ABNORMAL LOW
RDW: 14.3
WBC: 5.4
WBC: 6.4
WBC: 7.6

## 2011-08-23 LAB — COMPREHENSIVE METABOLIC PANEL
ALT: 45 — ABNORMAL HIGH
AST: 46 — ABNORMAL HIGH
BUN: 10
CO2: 24
CO2: 26
Calcium: 7.8 — ABNORMAL LOW
Chloride: 105
Chloride: 108
Creatinine, Ser: 0.58
Creatinine, Ser: 0.65
GFR calc non Af Amer: >60
GFR calc non Af Amer: >60
Glucose, Bld: 104 — ABNORMAL HIGH
Glucose, Bld: 82
Total Bilirubin: 0.5
Total Bilirubin: 1

## 2011-08-23 LAB — BASIC METABOLIC PANEL
BUN: 9
CO2: 24
Calcium: 6.9 — ABNORMAL LOW
Chloride: 104
Creatinine, Ser: 0.7
GFR calc Af Amer: >60

## 2011-08-23 LAB — URIC ACID
Uric Acid, Serum: 5
Uric Acid, Serum: 5.9

## 2011-08-23 LAB — TSH: TSH: 1.587

## 2011-08-24 ENCOUNTER — Inpatient Hospital Stay (HOSPITAL_COMMUNITY)
Admission: AD | Admit: 2011-08-24 | Discharge: 2011-08-24 | Disposition: A | Discharge status: Home / Self Care | Payer: Self-pay | Source: Ambulatory Visit | Attending: Obstetrics & Gynecology | Admitting: Obstetrics & Gynecology

## 2011-08-24 ENCOUNTER — Encounter (HOSPITAL_COMMUNITY): Payer: Self-pay

## 2011-08-24 DIAGNOSIS — R1115 Cyclical vomiting syndrome unrelated to migraine: Secondary | ICD-10-CM

## 2011-08-24 DIAGNOSIS — O210 Mild hyperemesis gravidarum: Secondary | ICD-10-CM | POA: Insufficient documentation

## 2011-08-24 DIAGNOSIS — K219 Gastro-esophageal reflux disease without esophagitis: Secondary | ICD-10-CM

## 2011-08-24 DIAGNOSIS — R1110 Vomiting, unspecified: Secondary | ICD-10-CM

## 2011-08-24 LAB — URINALYSIS, ROUTINE W REFLEX MICROSCOPIC
Glucose, UA: NEGATIVE mg/dL
Specific Gravity, Urine: 1.02 (ref 1.005–1.030)
pH: 7.5 (ref 5.0–8.0)

## 2011-08-24 LAB — URINE MICROSCOPIC-ADD ON

## 2011-08-24 MED ORDER — FAMOTIDINE IN NACL 20-0.9 MG/50ML-% IV SOLN
20.0000 mg | Freq: Once | INTRAVENOUS | Status: AC
  Administered 2011-08-24: 20 mg via INTRAVENOUS

## 2011-08-24 MED ORDER — LACTATED RINGERS IV SOLN
Freq: Once | INTRAVENOUS | Status: AC
  Administered 2011-08-24: 20:00:00 via INTRAVENOUS

## 2011-08-24 MED ORDER — FAMOTIDINE IN NACL 20-0.9 MG/50ML-% IV SOLN
20.0000 mg | Freq: Two times a day (BID) | INTRAVENOUS | Status: DC
  Filled 2011-08-24: qty 50

## 2011-08-24 MED ORDER — PROMETHAZINE HCL 25 MG/ML IJ SOLN
25.0000 mg | INTRAMUSCULAR | Status: DC
  Administered 2011-08-24: 25 mg via INTRAVENOUS
  Filled 2011-08-24: qty 1

## 2011-08-24 MED ORDER — ONDANSETRON HCL 4 MG/2ML IJ SOLN
4.0000 mg | Freq: Once | INTRAMUSCULAR | Status: AC
  Administered 2011-08-24: 4 mg via INTRAVENOUS
  Filled 2011-08-24: qty 2

## 2011-08-24 MED ORDER — PROMETHAZINE HCL 50 MG PO TABS: 50.0000 mg | ORAL_TABLET | Freq: Four times a day (QID) | ORAL | Status: AC | PRN

## 2011-08-24 MED ORDER — FAMOTIDINE 20 MG PO TABS: 20.0000 mg | ORAL_TABLET | Freq: Two times a day (BID) | ORAL | Status: DC

## 2011-08-24 NOTE — Progress Notes (Signed)
Pt states n/v worse, constant spitting, started having lower abd cramping after n/v, still bleeding since last visit. Was spotting, now small bleeding

## 2011-08-24 NOTE — Progress Notes (Signed)
Nausea and vomiting, patient actively vomiting in triage, onset of stomach cramping yesterday.

## 2011-08-24 NOTE — ED Provider Notes (Signed)
Chief Complaint:  Morning Sickness and Abdominal Pain   Leah Dominguez is  30 y.o. [id number].  Patient's last menstrual period was 07/09/2011. [redacted]w[redacted]d Her pregnancy status is positive, confirmed by [place] 08/19/11 She presents complaining of Morning Sickness and Abdominal Pain . Onset is described as insidious and has been present for  24 hours. Reports vomiting 3 times today. Increase abd pain with vomiting. States burning in chest since being in MAU.  Obstetrical/Gynecological History: OB History    Grav Para Term Preterm Abortions TAB SAB Ect Mult Living   6 2 2  0 3 2 1  0 0 2      Past Medical History: Past Medical History  Diagnosis Date  . Hypertension     PIH with last pregnancy  . Pregnancy induced hypertension     Past Surgical History: Past Surgical History  Procedure Date  . No past surgeries     Family History: No family history on file.  Social History: History  Substance Use Topics  . Smoking status: Never Smoker   . Smokeless tobacco: Not on file  . Alcohol Use: No    Allergies: No Known Allergies  Prescriptions prior to admission  Medication Sig Dispense Refill  . acetaminophen (TYLENOL) 325 MG tablet Take 325 mg by mouth every 6 (six) hours as needed. For headache.       . ondansetron (ZOFRAN ODT) 8 MG disintegrating tablet Take 1 tablet (8 mg total) by mouth every 8 (eight) hours as needed for nausea.  20 tablet  0  . metroNIDAZOLE (FLAGYL) 500 MG tablet Take 1 tablet (500 mg total) by mouth 2 (two) times daily.  14 tablet  0  . prenatal vitamin w/FE, FA (NATACHEW) 29-1 MG CHEW Chew 1 tablet by mouth daily.  30 tablet  5    Review of Systems - Negative except what has been reviewed in the HPI History obtained from chart review and the patient  Physical Exam   Blood pressure 123/87, pulse 109, temperature 99.4 F (37.4 C), temperature source Oral, resp. rate 16, height 5\' 11"  (1.803 m), weight 87.544 kg (193 lb), last menstrual period  07/09/2011.  General: General appearance - alert, well appearing, and in no distress and oriented to person, place, and time Chest - clear to auscultation, no wheezes, rales or rhonchi, symmetric air entry Heart - normal rate, regular rhythm, normal S1, S2, no murmurs, rubs, clicks or gallops Abdomen - soft, nontender, nondistended, no masses or organomegaly Focused Gynecological Exam: examination not indicated  Labs: Recent Results (from the past 24 hour(s))  URINALYSIS, ROUTINE W REFLEX MICROSCOPIC   Collection Time   08/24/11  3:45 PM      Component Value Range   Color, Urine YELLOW  YELLOW    Appearance CLEAR  CLEAR    Specific Gravity, Urine 1.020  1.005 - 1.030    pH 7.5  5.0 - 8.0    Glucose, UA NEGATIVE  NEGATIVE (mg/dL)   Hgb urine dipstick LARGE (*) NEGATIVE    Bilirubin Urine SMALL (*) NEGATIVE    Ketones, ur 15 (*) NEGATIVE (mg/dL)   Protein, ur NEGATIVE  NEGATIVE (mg/dL)   Urobilinogen, UA >4.5 (*) 0.0 - 1.0 (mg/dL)   Nitrite NEGATIVE  NEGATIVE    Leukocytes, UA TRACE (*) NEGATIVE   URINE MICROSCOPIC-ADD ON   Collection Time   08/24/11  3:45 PM      Component Value Range   Squamous Epithelial / LPF RARE  RARE    WBC,  UA 0-2  <3 (WBC/hpf)   RBC / HPF 3-6  <3 (RBC/hpf)   Bacteria, UA FEW (*) RARE    Ed Course: IVF fluid and pepcid ordered   Assessment: Hyperemesis: Improved GERD   Plan: Discharge home Rx Phenergan and pepcid FU with the OB/Gyn provider of choice  [name] E. 08/24/2011,5:06 PM

## 2011-08-26 HISTORY — PX: INDUCED ABORTION: SHX677

## 2011-08-30 LAB — URINALYSIS, ROUTINE W REFLEX MICROSCOPIC
Glucose, UA: NEGATIVE
Glucose, UA: NEGATIVE
Hgb urine dipstick: NEGATIVE
Ketones, ur: >80 — AB
Nitrite: NEGATIVE
Protein, ur: NEGATIVE
Protein, ur: NEGATIVE
Urobilinogen, UA: 1

## 2011-08-30 LAB — POCT PREGNANCY, URINE: Preg Test, Ur: POSITIVE

## 2011-09-10 ENCOUNTER — Inpatient Hospital Stay (HOSPITAL_COMMUNITY)
Admission: AD | Admit: 2011-09-10 | Discharge: 2011-09-11 | Disposition: A | Discharge status: Home / Self Care | Payer: Self-pay | Source: Ambulatory Visit | Attending: Obstetrics & Gynecology | Admitting: Obstetrics & Gynecology

## 2011-09-10 ENCOUNTER — Inpatient Hospital Stay (HOSPITAL_COMMUNITY): Payer: Self-pay

## 2011-09-10 ENCOUNTER — Encounter (HOSPITAL_COMMUNITY): Payer: Self-pay | Admitting: *Deleted

## 2011-09-10 DIAGNOSIS — A499 Bacterial infection, unspecified: Secondary | ICD-10-CM | POA: Insufficient documentation

## 2011-09-10 DIAGNOSIS — R109 Unspecified abdominal pain: Secondary | ICD-10-CM | POA: Insufficient documentation

## 2011-09-10 DIAGNOSIS — B9689 Other specified bacterial agents as the cause of diseases classified elsewhere: Secondary | ICD-10-CM | POA: Insufficient documentation

## 2011-09-10 DIAGNOSIS — N760 Acute vaginitis: Secondary | ICD-10-CM | POA: Insufficient documentation

## 2011-09-10 LAB — WET PREP, GENITAL: Trich, Wet Prep: NONE SEEN

## 2011-09-10 LAB — CBC
MCH: 29.9 pg (ref 26.0–34.0)
MCHC: 33.4 g/dL (ref 30.0–36.0)
Platelets: 249 10*3/uL (ref 150–400)
RBC: 3.98 MIL/uL (ref 3.87–5.11)

## 2011-09-10 LAB — DIFFERENTIAL
Basophils Relative: 0 % (ref 0–1)
Eosinophils Absolute: 0 10*3/uL (ref 0.0–0.7)
Lymphs Abs: 3 10*3/uL (ref 0.7–4.0)
Neutro Abs: 1.7 10*3/uL (ref 1.7–7.7)
Neutrophils Relative %: 33 % — ABNORMAL LOW (ref 43–77)

## 2011-09-10 LAB — HCG, QUANTITATIVE, PREGNANCY: hCG, Beta Chain, Quant, S: 1380 m[IU]/mL — ABNORMAL HIGH (ref <5)

## 2011-09-10 NOTE — ED Provider Notes (Addendum)
History     CSN: [id number] Arrival date & time: 09/10/2011 10:16 PM   None     No chief complaint on file.   HPI Leah Dominguez  Is a 30 y.o. female who presents to [facility]U for lower abdominal pain. She was evaluated here 08/19/11 and ultrasound showed a 7 week twin gestation. The patient went soon after that for a termination. Since the TAB she has had lower abdominal cramping. She is taking OC's for birth control. She is not sure of the doctor who did the procedure. She was given Rx for BV on her last visit here but has not gotten it filled. The history was provided by the patient.  Past Medical History  Diagnosis Date  . Hypertension     PIH with last pregnancy  . Pregnancy induced hypertension     Past Surgical History  Procedure Date  . No past surgeries     No family history on file.  History  Substance Use Topics  . Smoking status: Never Smoker   . Smokeless tobacco: Not on file  . Alcohol Use: No    OB History    Grav Para Term Preterm Abortions TAB SAB Ect Mult Living   7 2 2  0 4 3 1  0 0 2      Review of Systems  Constitutional: Negative for fever, chills, diaphoresis and fatigue.  HENT: Negative for ear pain, congestion, sore throat, facial swelling, neck pain, neck stiffness, dental problem and sinus pressure.   Eyes: Negative for photophobia, pain and discharge.  Respiratory: Negative for cough, chest tightness and wheezing.   Cardiovascular: Negative.   Gastrointestinal: Positive for abdominal pain. Negative for nausea and vomiting.  Genitourinary: Positive for vaginal discharge and pelvic pain. Negative for dysuria, frequency, flank pain and difficulty urinating.  Musculoskeletal: Negative for myalgias, back pain and gait problem.  Skin: Negative for color change and rash.  Neurological: Negative for dizziness, speech difficulty, weakness, light-headedness, numbness and headaches.  Psychiatric/Behavioral: Negative for confusion and agitation.     Allergies  Review of patient's allergies indicates no known allergies.  Home Medications  No current outpatient prescriptions on file.  BP 120/72  Pulse 72  Temp(Src) 99 F (37.2 C) (Oral)  Resp 18  Ht 5\' 9"  (1.753 m)  Wt 203 lb 4 oz (92.194 kg)  BMI 30.01 kg/m2  LMP 07/09/2011  Breastfeeding? Unknown  Physical Exam  Nursing note and vitals reviewed. Constitutional: She is oriented to person, place, and time. She appears well-developed and well-nourished.  HENT:  Head: Normocephalic and atraumatic.  Eyes: EOM are normal.  Neck: Neck supple.  Cardiovascular: Normal rate.   Pulmonary/Chest: Effort normal.  Abdominal: Soft. There is no tenderness.  Genitourinary:       Frothy discharge vaginal vault, cervix is friable, no adnexal tenderness. Uterus not enlarged.  Musculoskeletal: Normal range of motion.  Neurological: She is alert and oriented to person, place, and time. No cranial nerve deficit.  Skin: Skin is warm and dry.   US Transvaginal Non-ob  09/11/2011  *RADIOLOGY REPORT*  Clinical Data: Recent abortion.  Pelvic pain.  TRANSVAGINAL ULTRASOUND OF PELVIS  Technique:  Transvaginal ultrasound examination of the pelvis was performed including evaluation of the uterus, ovaries, adnexal regions, and pelvic cul-de-sac.  Comparison:  08/19/2011  Findings:  Uterus:  9.8 x 4.9 x 6.1 cm.  No focal abnormality.  Endometrium: The endometrium is inhomogeneous.  Focal area of thickening in the fundus measuring 11 mm.  Right ovary:  3.6 x 1.6 x 1.6 cm. Normal size and echotexture.  No adnexal masses.  Left ovary: 2.2 x 1.5 x 1.3 cm. Normal size and echotexture.  No adnexal masses.  Other Findings:  No free fluid.  IMPRESSION: Endometrium is focally prominent within the fundus.  There is heterogeneous material within this prominent portion of endometrium.  This could represent blood or retained products of conception.  Original Report Authenticated By: Cyndie Chime, M.D.   Results for  orders placed during the hospital encounter of 09/10/11 (from the past 24 hour(s))  WET PREP, GENITAL     Status: Abnormal   Collection Time   09/10/11 10:00 PM      Component Value Range   Yeast, Wet Prep NONE SEEN  NONE SEEN    Trich, Wet Prep NONE SEEN  NONE SEEN    Clue Cells, Wet Prep MODERATE (*) NONE SEEN    WBC, Wet Prep HPF POC MODERATE (*) NONE SEEN   CBC     Status: Abnormal   Collection Time   09/10/11 11:19 PM      Component Value Range   WBC 5.0  4.0 - 10.5 (K/uL)   RBC 3.98  3.87 - 5.11 (MIL/uL)   Hemoglobin 11.9 (*) 12.0 - 15.0 (g/dL)   HCT 96.0 (*) 45.4 - 46.0 (%)   MCV 89.4  78.0 - 100.0 (fL)   MCH 29.9  26.0 - 34.0 (pg)   MCHC 33.4  30.0 - 36.0 (g/dL)   RDW 09.8  11.9 - 14.7 (%)   Platelets 249  150 - 400 (K/uL)  DIFFERENTIAL     Status: Abnormal   Collection Time   09/10/11 11:19 PM      Component Value Range   Neutrophils Relative 33 (*) 43 - 77 (%)   Neutro Abs 1.7  1.7 - 7.7 (K/uL)   Lymphocytes Relative 59 (*) 12 - 46 (%)   Lymphs Abs 3.0  0.7 - 4.0 (K/uL)   Monocytes Relative 8  3 - 12 (%)   Monocytes Absolute 0.4  0.1 - 1.0 (K/uL)   Eosinophils Relative 0  0 - 5 (%)   Eosinophils Absolute 0.0  0.0 - 0.7 (K/uL)   Basophils Relative 0  0 - 1 (%)   Basophils Absolute 0.0  0.0 - 0.1 (K/uL)  HCG, QUANTITATIVE, PREGNANCY     Status: Abnormal   Collection Time   09/10/11 11:19 PM      Component Value Range   hCG, Beta Chain, Quant, S 1380 (*) <5 (mIU/mL)   ED Course  Procedures   Assessment: Abdominal cramping s/p TAB   Bacterial vaginosis  Plan:  Consult with Dr. Marice Potter   Repeat Bhcg in one week   Return for fever or severe pain   Get Rx for flagyl filled    MDM     Kerrie Buffalo, NP 09/16/11 1406  [place]oral [place], NP 09/18/11 1729

## 2011-09-10 NOTE — Progress Notes (Signed)
PT  SAYS HAD TAB ON 08-19-2011- AT  [facility] [address] [address]-   GAVE HER MEDS - TOOK ALL.  SAYS SINCE HAD DONE HAS BEEN HAVING CRAMPS -  CALL OFFICE- TOLD TO COME IN - COULDN'T.  TODAY - CRAMPING- CALLED OFFICE AGAIN- SHE HOME PREG TEST- POST. [facility] BC PILLS- NOT MISSED ANY.     SHE WAS 7 WEEKS PREG AT TIME- HAD TWINS.  THEY DID U/S..  WAS IN [facility]U  - HAD U/S - BEFORE TAB-  08-14-2011.    TOOK IBUPROFEN AT 4 PM-  WITH NO RELIEF

## 2011-09-10 NOTE — Progress Notes (Signed)
Pt strates she had an abortion at the clinic on [address] 2 weeks ago-since then has been cramping and spotting since then-the cramping has gotten worse and worse and she is concerned-states she also had a postitive home UPT

## 2011-09-11 LAB — GC/CHLAMYDIA PROBE AMP, GENITAL: GC Probe Amp, Genital: NEGATIVE

## 2011-09-12 LAB — POCT URINALYSIS DIP (DEVICE)
Glucose, UA: NEGATIVE
Nitrite: NEGATIVE
Operator id: 235561
Protein, ur: 30 — AB
Specific Gravity, Urine: 1.02
Urobilinogen, UA: >=8
pH: 7

## 2011-10-29 ENCOUNTER — Inpatient Hospital Stay (HOSPITAL_COMMUNITY)
Admission: AD | Admit: 2011-10-29 | Discharge: 2011-10-29 | Disposition: A | Discharge status: Home / Self Care | Payer: Self-pay | Source: Ambulatory Visit | Attending: Obstetrics & Gynecology | Admitting: Obstetrics & Gynecology

## 2011-10-29 ENCOUNTER — Encounter (HOSPITAL_COMMUNITY): Payer: Self-pay | Admitting: *Deleted

## 2011-10-29 DIAGNOSIS — R109 Unspecified abdominal pain: Secondary | ICD-10-CM | POA: Insufficient documentation

## 2011-10-29 DIAGNOSIS — A499 Bacterial infection, unspecified: Secondary | ICD-10-CM

## 2011-10-29 DIAGNOSIS — N760 Acute vaginitis: Secondary | ICD-10-CM | POA: Insufficient documentation

## 2011-10-29 DIAGNOSIS — B9689 Other specified bacterial agents as the cause of diseases classified elsewhere: Secondary | ICD-10-CM | POA: Insufficient documentation

## 2011-10-29 DIAGNOSIS — R51 Headache: Secondary | ICD-10-CM | POA: Insufficient documentation

## 2011-10-29 LAB — WET PREP, GENITAL: Yeast Wet Prep HPF POC: NONE SEEN

## 2011-10-29 LAB — CBC
HCT: 37.3 % (ref 36.0–46.0)
MCH: 29.8 pg (ref 26.0–34.0)
MCV: 89.7 fL (ref 78.0–100.0)
Platelets: 203 10*3/uL (ref 150–400)
RBC: 4.16 MIL/uL (ref 3.87–5.11)
RDW: 13.1 % (ref 11.5–15.5)
WBC: 4.7 10*3/uL (ref 4.0–10.5)

## 2011-10-29 LAB — URINE MICROSCOPIC-ADD ON

## 2011-10-29 LAB — URINALYSIS, ROUTINE W REFLEX MICROSCOPIC
Glucose, UA: NEGATIVE mg/dL
Leukocytes, UA: NEGATIVE
Specific Gravity, Urine: 1.02 (ref 1.005–1.030)

## 2011-10-29 LAB — POCT PREGNANCY, URINE: Preg Test, Ur: NEGATIVE

## 2011-10-29 MED ORDER — NAPROXEN SODIUM 550 MG PO TABS: 550.0000 mg | ORAL_TABLET | Freq: Two times a day (BID) | ORAL | Status: DC

## 2011-10-29 MED ORDER — CITALOPRAM HYDROBROMIDE 20 MG PO TABS: 20.0000 mg | ORAL_TABLET | Freq: Every day | ORAL | Status: DC

## 2011-10-29 MED ORDER — METRONIDAZOLE 500 MG PO TABS: 500.0000 mg | ORAL_TABLET | Freq: Two times a day (BID) | ORAL | Status: DC

## 2011-10-29 NOTE — Progress Notes (Signed)
Patient states she has been having headaches for about one month, started having left lower abdominal pain on 12-1. Patient states that on 10-1 she had terminated a pregnancy and has not had a period since that time.

## 2011-10-29 NOTE — ED Provider Notes (Signed)
Attestation of Attending Supervision of Advanced Practitioner: Evaluation and management procedures were performed by the PA/NP/CNM/OB Fellow under my supervision/collaboration. Chart reviewed, and agree with management and plan.  Jaynie Collins, M.D. 10/29/2011 6:55 PM

## 2011-10-29 NOTE — ED Provider Notes (Addendum)
History   Pt presents today c/o lower abd cramping and HA. She states she had an induced AB on 08/26/11. She has not had menses since that time. She is currently taking OCPs. She denies fever, vag dc, irritation. She states the pains and HAs have been "coming and going" over the past 4 days.  Chief Complaint  Patient presents with  . Headache  . Abdominal Pain   HPI  OB History    Grav Para Term Preterm Abortions TAB SAB Ect Mult Living   7 2 2  0 4 3 1  0 0 2      Past Medical History  Diagnosis Date  . Hypertension     PIH with last pregnancy  . Pregnancy induced hypertension   . Bacterial vaginosis     Past Surgical History  Procedure Date  . No past surgeries     Family History  Problem Relation Age of Onset  . Anesthesia problems Neg Hx     History  Substance Use Topics  . Smoking status: Never Smoker   . Smokeless tobacco: Never Used  . Alcohol Use: No    Allergies: No Known Allergies  Prescriptions prior to admission  Medication Sig Dispense Refill  . acetaminophen (TYLENOL) 325 MG tablet Take 325 mg by mouth every 6 (six) hours as needed. For headache.       . ibuprofen (ADVIL,MOTRIN) 200 MG tablet Take 200 mg by mouth every 6 (six) hours as needed. For pain       . norethindrone-ethinyl estradiol (JUNEL FE,GILDESS FE,LOESTRIN FE) 1-20 MG-MCG tablet Take 1 tablet by mouth daily.          Review of Systems  Constitutional: Negative for fever.  Eyes: Negative for blurred vision.  Cardiovascular: Negative for chest pain.  Gastrointestinal: Positive for abdominal pain. Negative for nausea, vomiting, diarrhea and constipation.  Genitourinary: Negative for dysuria, urgency, frequency and hematuria.  Neurological: Positive for headaches. Negative for dizziness.  Psychiatric/Behavioral: Negative for depression and suicidal ideas.   Physical Exam   Blood pressure 123/81, pulse 80, temperature 98.9 F (37.2 C), temperature source Oral, resp. rate 20, height 5'  11" (1.803 m), weight 206 lb 3.2 oz (93.532 kg), last menstrual period 07/09/2011, SpO2 99.00%.  Physical Exam  Nursing note and vitals reviewed. Constitutional: She is oriented to person, place, and time. She appears well-developed and well-nourished. No distress.  HENT:  Head: Normocephalic and atraumatic.  Eyes: EOM are normal. Pupils are equal, round, and reactive to light.  GI: Soft. She exhibits no distension. There is no tenderness. There is no rebound and no guarding.  Genitourinary: No bleeding around the vagina. Vaginal discharge found.       Cervix Lg/closed. Uterus NL size and shape. No adnexal masses. Thin, white vag dc present in the vag vault.  Neurological: She is alert and oriented to person, place, and time.  Skin: Skin is warm and dry. She is not diaphoretic.  Psychiatric: She has a normal mood and affect. Her behavior is normal. Judgment and thought content normal.    [facility]U Course  Procedures  Wet prep and GC/Chlamydia cultures done.  Results for orders placed during the hospital encounter of 10/29/11 (from the past 24 hour(s))  URINALYSIS, ROUTINE W REFLEX MICROSCOPIC     Status: Abnormal   Collection Time   10/29/11  3:05 PM      Component Value Range   Color, Urine YELLOW  YELLOW    APPearance CLEAR  CLEAR  Specific Gravity, Urine 1.020  1.005 - 1.030    pH 7.0  5.0 - 8.0    Glucose, UA NEGATIVE  NEGATIVE (mg/dL)   Hgb urine dipstick TRACE (*) NEGATIVE    Bilirubin Urine NEGATIVE  NEGATIVE    Ketones, ur NEGATIVE  NEGATIVE (mg/dL)   Protein, ur NEGATIVE  NEGATIVE (mg/dL)   Urobilinogen, UA 4.0 (*) 0.0 - 1.0 (mg/dL)   Nitrite NEGATIVE  NEGATIVE    Leukocytes, UA NEGATIVE  NEGATIVE   URINE MICROSCOPIC-ADD ON     Status: Abnormal   Collection Time   10/29/11  3:05 PM      Component Value Range   Squamous Epithelial / LPF FEW (*) RARE    WBC, UA 0-2  <3 (WBC/hpf)   RBC / HPF 0-2  <3 (RBC/hpf)   Bacteria, UA RARE  RARE   POCT PREGNANCY, URINE     Status:  Normal   Collection Time   10/29/11  4:24 PM      Component Value Range   Preg Test, Ur NEGATIVE    WET PREP, GENITAL     Status: Abnormal   Collection Time   10/29/11  4:31 PM      Component Value Range   Yeast, Wet Prep NONE SEEN  NONE SEEN    Trich, Wet Prep NONE SEEN  NONE SEEN    Clue Cells, Wet Prep FEW (*) NONE SEEN    WBC, Wet Prep HPF POC MODERATE (*) NONE SEEN   CBC     Status: Normal   Collection Time   10/29/11  4:40 PM      Component Value Range   WBC 4.7  4.0 - 10.5 (K/uL)   RBC 4.16  3.87 - 5.11 (MIL/uL)   Hemoglobin 12.4  12.0 - 15.0 (g/dL)   HCT 16.1  09.6 - 04.5 (%)   MCV 89.7  78.0 - 100.0 (fL)   MCH 29.8  26.0 - 34.0 (pg)   MCHC 33.2  30.0 - 36.0 (g/dL)   RDW 40.9  81.1 - 91.4 (%)   Platelets 203  150 - 400 (K/uL)     Assessment and Plan  Abd pain/HA: discussed with pt at length. Will give Rx for anaprox ds. She will f/u with her PCP. Discussed diet, activity, risks, and precautions.  BV: will also give Rx for Flagyl. Warned of antabuse reaction.   Clinton Gallant. Rice III, DrHSc, MPAS, PA-C  10/29/2011, 4:32 PM   Henrietta Hoover, PA 10/29/11 1702  Addendum: As I was giving pt discharge instructions, she wanted to know who to talk to about depression. She states she has been "very stressed". She denies homicidal or suicidal ideations. Will give Rx for Celexa 20mg  po qd. She will f/u with her PCP tomorrow. Discussed diet, activity, risks, and precautions.  Clinton Gallant. Rice III, DrHSc, MPAS, PA-C   Henrietta Hoover, PA 10/29/11 1715

## 2011-10-29 NOTE — ED Provider Notes (Signed)
Attestation of Attending Supervision of Advanced Practitioner: Evaluation and management procedures were performed by the PA/NP/CNM/OB Fellow under my supervision/collaboration. Chart reviewed, and agree with management and plan.  Jaynie Collins, M.D. 10/29/2011 5:09 PM

## 2011-10-31 ENCOUNTER — Ambulatory Visit (INDEPENDENT_AMBULATORY_CARE_PROVIDER_SITE_OTHER): Payer: Self-pay | Admitting: Family Medicine

## 2011-10-31 ENCOUNTER — Encounter: Payer: Self-pay | Admitting: Family Medicine

## 2011-10-31 DIAGNOSIS — R519 Headache, unspecified: Secondary | ICD-10-CM | POA: Insufficient documentation

## 2011-10-31 DIAGNOSIS — R51 Headache: Secondary | ICD-10-CM

## 2011-10-31 DIAGNOSIS — F341 Dysthymic disorder: Secondary | ICD-10-CM

## 2011-10-31 NOTE — Patient Instructions (Addendum)
It was nice to meet you today.  I'm so sorry you're having these problems. I recommend that you meet with our psychologist, Dr. Spero Geralds, for help dealing with your depression and anxiety.  You can schedule an appointment with her by calling her directly at [phone]. Please start taking the Celexa that they prescribed you at the MAU.  This will take 4-6 weeks to reach it's maximum effect, so keep taking it even if you don't feel like it's helping! I agree with keeping you out of work for the rest of this week.  I think, however, that it will be better for you to be around people and at work during this time.   Please come back and see me in 2-3 weeks so we can see how you ae doing.  Please call or go to the nearest ER if you have feelings of wanting to hurt yourself or anyone else.     Depression, Adolescent and Adult Depression is a true and treatable medical condition. In general there are two kinds of depression:  Depression we all experience in some form. For example depression from the death of a loved one, financial distress or natural disasters will trigger or increase depression.   Clinical depression, on the other hand, appears without an apparent cause or reason. This depression is a disease. Depression may be caused by chemical imbalance in the body and brain or may come as a response to a physical illness. Alcohol and other drugs can cause depression.  DIAGNOSIS  The diagnosis of depression is usually based upon symptoms and medical history. TREATMENT  Treatments for depression fall into three categories. These are:  Drug therapy. There are many medicines that treat depression. Responses may vary and sometimes trial and error is necessary to determine the best medicines and dosage for a particular patient.   Psychotherapy, also called talking treatments, helps people resolve their problems by looking at them from a different point of view and by giving people insight into their  own personal makeup. Traditional psychotherapy looks at a childhood source of a problem. Other psychotherapy will look at current conflicts and move toward solving those. If the cause of depression is drug use, counseling is available to help abstain. In time the depression will usually improve. If there were underlying causes for the chemical use, they can be addressed.   ECT (electroconvulsive therapy) or shock treatment is not as commonly used today. It is a very effective treatment for severe suicidal depression. During ECT electrical impulses are applied to the head. These impulses cause a generalized seizure. It can be effective but causes a loss of memory for recent events. Sometimes this loss of memory may include the last several months.  Treat all depression or suicide threats as serious. Obtain professional help. Do not wait to see if serious depression will get better over time without help. Seek help for yourself or those around you. In the [place] the number to the National Suicide Help Lines With 24 Hour Help Are: 1-800-SUICIDE (820)693-3407 Document Released: 11/08/2000 Document Revised: 07/24/2011 Document Reviewed: 06/29/2008 [facility] Patient Information 2012 [place], [place].

## 2011-10-31 NOTE — Progress Notes (Signed)
S: Pt comes in today for anxiety/depression.   DEPRESSION Patient reports having a TAB in early October at 8 weeks.  She states that she and her boyfriend came to this decision together, however she was not completely opposed to carrying out the pregnancy. She does endorse that it was bad timing was starting a new job and not having health insurance and so this is why she went through with having the abortion. Since that time, she has not had any desire to do any activities. She's been able to continue to go to work and care for her 39 in 52-year-old children home, but states that she would rather be alone and isolated. She also feels like she is very nervous and anxious. She states "everything is all my mind all the time". She just feels like she is very distracted. She's had poor sleeping since the beginning of October stating that she wakes up every 1-2 hours feeling like her "mind is going". She says that her appetite has been okay but she is never been a great eater. She was seen in the MAU 2 days ago and was given a prescription for Celexa. She has not yet started this medicine yet because she is worried about becoming dependent on it. She states she has no personal or family history of depression or anxiety. She's never been hospitalized in the past. She has never been on antidepressants or anxiolytic medications. She denies suicidal or homicidal ideation at this time. She denies a desire to hurt yourself, would just like to be isolated.   HEADACHE She's also been having headaches for the past week. She states that these are throbbing, mostly over her right eye. She states that they have been happening every day for the past 7 days. The severity waxes and wanes. She's taking Tylenol and Advil 2-3 times per day to help with these. There is no associated photophobia, phonophobia, nausea, vomiting, or vision changes. She has not noted or or sensation before they come on. She is able to work and do needed  activities through these headaches, they're just uncomfortable. She's never had an issue with headaches in the past.   ROS: Per HPI  History  Smoking status  . Never Smoker   Smokeless tobacco  . Never Used    O:  Filed Vitals:   10/31/11 0842  BP: 121/85  Pulse: 73  Temp: 97.2 F (36.2 C)    Gen: NAD HEENT: EOMI, PERRLA CV: RRR, no murmur Pulm: CTA bilat, no wheezes or crackles Psych: alert, oriented, tearful during entirety of H&P, logical, non-pressured   PHQ-9: 17, very difficult GAD-7: 17, very difficult   A/P: 30 y.o. female p/w dysthymia  -See problem list -f/u in 2-3 weeks

## 2011-10-31 NOTE — Assessment & Plan Note (Signed)
Most c/w tension headache, no red flags, no migraine-like symptoms.  Will continue to monitor. Pt given Rx for naproxen in MAU and will fill this.

## 2011-10-31 NOTE — Assessment & Plan Note (Signed)
Pt given Dr. Carola Rhine information- encouraged pt to call.  Given Rx for Celex in MAU, encouraged pt to start this, counseled that this may not be a "life long medicine."  Trigger was TAB 08/2011. No SI/HI. F/u 2-3 weeks. Red flags discussed with pt.

## 2011-11-12 ENCOUNTER — Telehealth: Payer: Self-pay | Admitting: Family Medicine

## 2011-11-12 NOTE — Telephone Encounter (Signed)
Calling to give dates needed on form for leave of absence for herself and  daughter.  Need to enter dates for 12/3 thru 12/15.  Please contact patient when completed.

## 2011-11-15 NOTE — Telephone Encounter (Signed)
Forms filled out/clarified and places in the "to be faxed" pile.

## 2011-12-10 ENCOUNTER — Encounter (HOSPITAL_BASED_OUTPATIENT_CLINIC_OR_DEPARTMENT_OTHER): Payer: Self-pay | Admitting: Emergency Medicine

## 2011-12-10 ENCOUNTER — Emergency Department (INDEPENDENT_AMBULATORY_CARE_PROVIDER_SITE_OTHER): Payer: Managed Care, Other (non HMO)

## 2011-12-10 ENCOUNTER — Emergency Department (HOSPITAL_BASED_OUTPATIENT_CLINIC_OR_DEPARTMENT_OTHER)
Admission: EM | Admit: 2011-12-10 | Discharge: 2011-12-10 | Disposition: A | Discharge status: Home / Self Care | Payer: Self-pay | Attending: Emergency Medicine | Admitting: Emergency Medicine

## 2011-12-10 DIAGNOSIS — N949 Unspecified condition associated with female genital organs and menstrual cycle: Secondary | ICD-10-CM

## 2011-12-10 DIAGNOSIS — R1032 Left lower quadrant pain: Secondary | ICD-10-CM

## 2011-12-10 DIAGNOSIS — I10 Essential (primary) hypertension: Secondary | ICD-10-CM | POA: Insufficient documentation

## 2011-12-10 DIAGNOSIS — N938 Other specified abnormal uterine and vaginal bleeding: Secondary | ICD-10-CM | POA: Insufficient documentation

## 2011-12-10 DIAGNOSIS — R58 Hemorrhage, not elsewhere classified: Secondary | ICD-10-CM

## 2011-12-10 DIAGNOSIS — R935 Abnormal findings on diagnostic imaging of other abdominal regions, including retroperitoneum: Secondary | ICD-10-CM

## 2011-12-10 DIAGNOSIS — N925 Other specified irregular menstruation: Secondary | ICD-10-CM | POA: Insufficient documentation

## 2011-12-10 LAB — PREGNANCY, URINE: Preg Test, Ur: NEGATIVE

## 2011-12-10 LAB — URINALYSIS, ROUTINE W REFLEX MICROSCOPIC
Nitrite: NEGATIVE
Specific Gravity, Urine: 1.031 — ABNORMAL HIGH (ref 1.005–1.030)
Urobilinogen, UA: 2 mg/dL — ABNORMAL HIGH (ref 0.0–1.0)
pH: 6 (ref 5.0–8.0)

## 2011-12-10 MED ORDER — MORPHINE SULFATE 4 MG/ML IJ SOLN
4.0000 mg | Freq: Once | INTRAMUSCULAR | Status: AC
  Administered 2011-12-10: 4 mg via INTRAVENOUS
  Filled 2011-12-10: qty 1

## 2011-12-10 MED ORDER — HYDROCODONE-ACETAMINOPHEN 5-500 MG PO TABS: 1.0000 | ORAL_TABLET | Freq: Four times a day (QID) | ORAL | Status: AC | PRN

## 2011-12-10 NOTE — ED Notes (Signed)
Pt c/o LLQ pain x 3-4 days; denies NVD; pt reports "spotting"

## 2011-12-10 NOTE — ED Provider Notes (Signed)
History     CSN: [id number]  Arrival date & time 12/10/11  1411   First MD Initiated Contact with Patient 12/10/11 1430      Chief Complaint  Patient presents with  . Abdominal Pain    (Consider location/radiation/quality/duration/timing/severity/associated sxs/prior treatment) HPI Comments: Patient presents with gradually worsening left lower quadrant pain.  She notes it started about 3 days ago and initially was intermittent in nature.  It is now become more constant in her left lower quadrant.  Patient denies any associated urinary or bowel symptoms.  She denies any fevers.  She has had some similar pains in the past which he says she was seen at [facility] hospital for and was not given a definitive diagnosis.  Denies history of ovarian cysts.  Patient states her last normal menstrual period was in October after which she had the D&C.  Since that time she states she's not had a normal period but has had only intermittent spotting.  She is on a birth control pill.  She has had some mild spotting the last 2 days.  Patient is a 31 y.o. female presenting with abdominal pain. The history is provided by the patient. No language interpreter was used.  Abdominal Pain The primary symptoms of the illness include abdominal pain and vaginal bleeding. The primary symptoms of the illness do not include fever, fatigue, shortness of breath, nausea, vomiting, diarrhea, hematemesis, hematochezia, dysuria or vaginal discharge. The current episode started more than 2 days ago. The onset of the illness was gradual. The problem has been gradually worsening.  Symptoms associated with the illness do not include chills, hematuria or back pain.    Past Medical History  Diagnosis Date  . Hypertension     PIH with last pregnancy  . Pregnancy induced hypertension   . Bacterial vaginosis     Past Surgical History  Procedure Date  . No past surgeries     Family History  Problem Relation Age of Onset  .  Anesthesia problems Neg Hx     History  Substance Use Topics  . Smoking status: Never Smoker   . Smokeless tobacco: Never Used  . Alcohol Use: No    OB History    Grav Para Term Preterm Abortions TAB SAB Ect Mult Living   7 2 2  0 4 3 1  0 0 2      Review of Systems  Constitutional: Negative.  Negative for fever, chills and fatigue.  HENT: Negative.   Eyes: Negative.  Negative for discharge and redness.  Respiratory: Negative.  Negative for cough and shortness of breath.   Cardiovascular: Negative.  Negative for chest pain.  Gastrointestinal: Positive for abdominal pain. Negative for nausea, vomiting, diarrhea, hematochezia and hematemesis.  Genitourinary: Positive for vaginal bleeding and pelvic pain. Negative for dysuria, hematuria, flank pain and vaginal discharge.  Musculoskeletal: Negative.  Negative for back pain.  Skin: Negative.  Negative for color change and rash.  Neurological: Negative.  Negative for syncope and headaches.  Hematological: Negative.  Negative for adenopathy.  Psychiatric/Behavioral: Negative.  Negative for confusion.  All other systems reviewed and are negative.    Allergies  Review of patient's allergies indicates no known allergies.  Home Medications   Current Outpatient Rx  Name Route Sig Dispense Refill  . ACETAMINOPHEN 325 MG PO TABS Oral Take 325 mg by mouth every 6 (six) hours as needed. For headache.     Marland Kitchen CITALOPRAM HYDROBROMIDE 20 MG PO TABS Oral Take 1 tablet (20  mg total) by mouth daily. 30 tablet 3  . IBUPROFEN 200 MG PO TABS Oral Take 200 mg by mouth every 6 (six) hours as needed. For pain     . NAPROXEN SODIUM 550 MG PO TABS Oral Take 1 tablet (550 mg total) by mouth 2 (two) times daily with a meal. 30 tablet 0  . NORETHIN ACE-ETH ESTRAD-FE 1-20 MG-MCG PO TABS Oral Take 1 tablet by mouth daily.        BP 123/80  Pulse 79  Temp(Src) 98.2 F (36.8 C) (Oral)  Resp 16  Wt 180 lb (81.647 kg)  SpO2 100%  Physical Exam  Nursing  note and vitals reviewed. Constitutional: She is oriented to person, place, and time. She appears well-developed and well-nourished.  Non-toxic appearance. She does not have a sickly appearance.  HENT:  Head: Normocephalic and atraumatic.  Eyes: Conjunctivae, EOM and lids are normal. Pupils are equal, round, and reactive to light. No scleral icterus.  Neck: Trachea normal and normal range of motion. Neck supple.  Cardiovascular: Normal rate, regular rhythm and normal heart sounds.   Pulmonary/Chest: Effort normal and breath sounds normal.  Abdominal: Soft. Normal appearance. There is tenderness. There is no rebound, no guarding and no CVA tenderness.       Mild suprapubic and left lower quadrant pain on exam without any rebound or guarding.  Genitourinary: There is no rash, tenderness, lesion or injury on the right labia. There is no rash, tenderness, lesion or injury on the left labia.       Examination chaperoned by Barth Kirks, RN.  External genitalia is normal and without rash or erythema.  No cervical motion tenderness on exam.  Mild tenderness to palpation of suprapubic region and left adnexa on exam.  Patient has blood present through the cervical os but no other abnormal discharge.  No lacerations or wounds are noted.  No cervical inflammation.  Cervical os is closed.  Musculoskeletal: Normal range of motion.  Neurological: She is alert and oriented to person, place, and time. She has normal strength.  Skin: Skin is warm, dry and intact. No rash noted.  Psychiatric: She has a normal mood and affect. Her behavior is normal. Judgment and thought content normal.    ED Course  Procedures (including critical care time)  Results for orders placed during the hospital encounter of 12/10/11  URINALYSIS, ROUTINE W REFLEX MICROSCOPIC      Component Value Range   Color, Urine AMBER (*) YELLOW    APPearance CLEAR  CLEAR    Specific Gravity, Urine 1.031 (*) 1.005 - 1.030    pH 6.0  5.0 - 8.0     Glucose, UA NEGATIVE  NEGATIVE (mg/dL)   Hgb urine dipstick NEGATIVE  NEGATIVE    Bilirubin Urine SMALL (*) NEGATIVE    Ketones, ur 15 (*) NEGATIVE (mg/dL)   Protein, ur NEGATIVE  NEGATIVE (mg/dL)   Urobilinogen, UA 2.0 (*) 0.0 - 1.0 (mg/dL)   Nitrite NEGATIVE  NEGATIVE    Leukocytes, UA NEGATIVE  NEGATIVE   PREGNANCY, URINE      Component Value Range   Preg Test, Ur NEGATIVE        MDM  Patient with left lower quadrant pain that is concerning for possible ovarian pathology such as ovarian cyst or potentially ovarian torsion although it seems less likely given the patient's moderate degree of pain rather than severe.  She is a negative pregnancy test so is not an ectopic pregnancy at this time.  She  shows no signs consistent with cervicitis or PID on exam.  She has no GI symptoms to indicate that this is a diverticulitis.  Her pain does not appear to be consistent with nephrolithiasis nor does she have blood in her urine to show signs of kidney stone.  Patient will have a pelvic ultrasound completed and these results will be followed up by my colleague Dr. Freida Busman.        Nat Christen, MD 12/10/11 1500

## 2011-12-10 NOTE — ED Notes (Signed)
Pt remains in US

## 2011-12-10 NOTE — ED Notes (Signed)
Pt returned from US

## 2011-12-10 NOTE — ED Provider Notes (Signed)
Results for orders placed during the hospital encounter of 12/10/11  URINALYSIS, ROUTINE W REFLEX MICROSCOPIC      Component Value Range   Color, Urine AMBER (*) YELLOW    APPearance CLEAR  CLEAR    Specific Gravity, Urine 1.031 (*) 1.005 - 1.030    pH 6.0  5.0 - 8.0    Glucose, UA NEGATIVE  NEGATIVE (mg/dL)   Hgb urine dipstick NEGATIVE  NEGATIVE    Bilirubin Urine SMALL (*) NEGATIVE    Ketones, ur 15 (*) NEGATIVE (mg/dL)   Protein, ur NEGATIVE  NEGATIVE (mg/dL)   Urobilinogen, UA 2.0 (*) 0.0 - 1.0 (mg/dL)   Nitrite NEGATIVE  NEGATIVE    Leukocytes, UA NEGATIVE  NEGATIVE   PREGNANCY, URINE      Component Value Range   Preg Test, Ur NEGATIVE    WET PREP, GENITAL      Component Value Range   Yeast, Wet Prep NONE SEEN  NONE SEEN    Trich, Wet Prep NONE SEEN  NONE SEEN    Clue Cells, Wet Prep MODERATE (*) NONE SEEN    WBC, Wet Prep HPF POC MODERATE (*) NONE SEEN    US Transvaginal Non-ob  12/10/2011  *RADIOLOGY REPORT*  Clinical Data:  Left lower quadrant pain  TRANSABDOMINAL AND TRANSVAGINAL ULTRASOUND OF PELVIS DOPPLER ULTRASOUND OF OVARIES  Technique:  Both transabdominal and transvaginal ultrasound examinations of the pelvis were performed. Transabdominal technique was performed for global imaging of the pelvis including uterus, ovaries, adnexal regions, and pelvic cul-de-sac.  It was necessary to proceed with endovaginal exam following the transabdominal exam to visualize the endometrium and adnexa.  Color and duplex Doppler ultrasound was utilized to evaluate blood flow to the ovaries.  Comparison:  September 10, 2011  Findings: The uterus has a normal size and echotexture, measuring 8.3 x 5.4 x 6.1 cm.  The endometrial stripe is within normal limits in width, measuring 9 mm but there is a suggestion of a filling defect at the fundus.  Both ovaries are normal in size and appearance.  The right ovary measures 2.7 x 1.8 x 2.4 cm, and the left ovary measures 2.9 x 1.8 x 2.6 cm.  There are no  adnexal masses or free pelvic fluid.  Pulsed Doppler evaluation demonstrates normal, symmetric low- resistance arterial and venous waveforms in both ovaries.  IMPRESSION: Question 5 mm filling defect within the endometrial cavity at the fundus which could represent an endometrial polyp. If there is a history of abnormal bleeding, hysterosonogram may be helpful for better characterization.  Normal ovaries. No sonographic evidence for ovarian torsion.  Original Report Authenticated By: Brandon Melnick, M.D.   US Pelvis Complete  12/10/2011  *RADIOLOGY REPORT*  Clinical Data:  Left lower quadrant pain  TRANSABDOMINAL AND TRANSVAGINAL ULTRASOUND OF PELVIS DOPPLER ULTRASOUND OF OVARIES  Technique:  Both transabdominal and transvaginal ultrasound examinations of the pelvis were performed. Transabdominal technique was performed for global imaging of the pelvis including uterus, ovaries, adnexal regions, and pelvic cul-de-sac.  It was necessary to proceed with endovaginal exam following the transabdominal exam to visualize the endometrium and adnexa.  Color and duplex Doppler ultrasound was utilized to evaluate blood flow to the ovaries.  Comparison:  September 10, 2011  Findings: The uterus has a normal size and echotexture, measuring 8.3 x 5.4 x 6.1 cm.  The endometrial stripe is within normal limits in width, measuring 9 mm but there is a suggestion of a filling defect at the fundus.  Both  ovaries are normal in size and appearance.  The right ovary measures 2.7 x 1.8 x 2.4 cm, and the left ovary measures 2.9 x 1.8 x 2.6 cm.  There are no adnexal masses or free pelvic fluid.  Pulsed Doppler evaluation demonstrates normal, symmetric low- resistance arterial and venous waveforms in both ovaries.  IMPRESSION: Question 5 mm filling defect within the endometrial cavity at the fundus which could represent an endometrial polyp. If there is a history of abnormal bleeding, hysterosonogram may be helpful for better  characterization.  Normal ovaries. No sonographic evidence for ovarian torsion.  Original Report Authenticated By: Brandon Melnick, M.D.   [place] Art/ven Flow Abd Pelv Doppler  12/10/2011  *RADIOLOGY REPORT*  Clinical Data:  Left lower quadrant pain  TRANSABDOMINAL AND TRANSVAGINAL ULTRASOUND OF PELVIS DOPPLER ULTRASOUND OF OVARIES  Technique:  Both transabdominal and transvaginal ultrasound examinations of the pelvis were performed. Transabdominal technique was performed for global imaging of the pelvis including uterus, ovaries, adnexal regions, and pelvic cul-de-sac.  It was necessary to proceed with endovaginal exam following the transabdominal exam to visualize the endometrium and adnexa.  Color and duplex Doppler ultrasound was utilized to evaluate blood flow to the ovaries.  Comparison:  September 10, 2011  Findings: The uterus has a normal size and echotexture, measuring 8.3 x 5.4 x 6.1 cm.  The endometrial stripe is within normal limits in width, measuring 9 mm but there is a suggestion of a filling defect at the fundus.  Both ovaries are normal in size and appearance.  The right ovary measures 2.7 x 1.8 x 2.4 cm, and the left ovary measures 2.9 x 1.8 x 2.6 cm.  There are no adnexal masses or free pelvic fluid.  Pulsed Doppler evaluation demonstrates normal, symmetric low- resistance arterial and venous waveforms in both ovaries.  IMPRESSION: Question 5 mm filling defect within the endometrial cavity at the fundus which could represent an endometrial polyp. If there is a history of abnormal bleeding, hysterosonogram may be helpful for better characterization.  Normal ovaries. No sonographic evidence for ovarian torsion.  Original Report Authenticated By: Brandon Melnick, M.D.    Care taken over at shift change, pending abd u/s.  Pt well pain controlled.  abd exam benign.  U/s shows questionable polyp.  Pt has appt to see [facility] clinic who manages her gyn care in 2 days.   Advised her to keep this appt, return her  for any worsening symptoms  Rolan Bucco, MD 12/10/11 1706

## 2011-12-11 LAB — GC/CHLAMYDIA PROBE AMP, GENITAL
Chlamydia, DNA Probe: NEGATIVE
GC Probe Amp, Genital: NEGATIVE

## 2011-12-12 ENCOUNTER — Encounter: Payer: Self-pay | Admitting: Family Medicine

## 2011-12-12 ENCOUNTER — Ambulatory Visit (INDEPENDENT_AMBULATORY_CARE_PROVIDER_SITE_OTHER): Payer: Managed Care, Other (non HMO) | Admitting: Family Medicine

## 2011-12-12 ENCOUNTER — Other Ambulatory Visit (HOSPITAL_COMMUNITY)
Admission: RE | Admit: 2011-12-12 | Discharge: 2011-12-12 | Disposition: A | Discharge status: Home / Self Care | Payer: Managed Care, Other (non HMO) | Source: Ambulatory Visit | Attending: Family Medicine | Admitting: Family Medicine

## 2011-12-12 VITALS — BP 125/86 | HR 61 | Temp 99.0°F | Ht 71.0 in | Wt 203.0 lb

## 2011-12-12 DIAGNOSIS — N949 Unspecified condition associated with female genital organs and menstrual cycle: Secondary | ICD-10-CM

## 2011-12-12 DIAGNOSIS — Z01419 Encounter for gynecological examination (general) (routine) without abnormal findings: Secondary | ICD-10-CM | POA: Insufficient documentation

## 2011-12-12 DIAGNOSIS — Z124 Encounter for screening for malignant neoplasm of cervix: Secondary | ICD-10-CM

## 2011-12-12 DIAGNOSIS — Z0000 Encounter for general adult medical examination without abnormal findings: Secondary | ICD-10-CM

## 2011-12-12 DIAGNOSIS — F341 Dysthymic disorder: Secondary | ICD-10-CM

## 2011-12-12 DIAGNOSIS — R102 Pelvic and perineal pain: Secondary | ICD-10-CM | POA: Insufficient documentation

## 2011-12-12 MED ORDER — METRONIDAZOLE 500 MG PO TABS: 500.0000 mg | ORAL_TABLET | Freq: Two times a day (BID) | ORAL | Status: AC

## 2011-12-12 NOTE — Progress Notes (Signed)
S: Pt comes in today for general exam.  DEPRESSION Feels like the medication is helping.  Significantly less crying and feels like she is more back to her normal self.  Still has some tough days and doesn't want to be around people as much as before.  No SI/HI.  Tolerating medication well. Sleeping well.  PHQ-9 score: 7.    PELVIC/LLQ PAIN Has been occuring x2-3 days, was seen at the [facility] on 1/15 and had full work up showing +clue cells on wet prep, normal transvaginal U/S with normal doppler flow to ovaries, only abnormality was a small 5mm fundal endometrial polyp.  Describes the pain as sharp, going from top of hip bone to pubic symphysis.  Is also concerned b/c she has not had a period since the TAB in October.  Is taking loestrin for birth control and had a negative urine pregnancy test 2 days ago.   GC/Chlam negative.  No discharge.  Does have complaint of one episode of bright red blood after intercourse.  No lubrication or foreplay before the intercourse.  Was immediately afterward and resolved spontaneously.  No vaginal pain or discomfort.    ROS: Per HPI  History  Smoking status  . Never Smoker   Smokeless tobacco  . Never Used    O:  Filed Vitals:   12/12/11 1437  BP: 125/86  Pulse: 61  Temp: 99 F (37.2 C)    Gen: NAD CV: RRR, no murmur Pulm: CTA bilat, no wheezes or crackles Abd: soft, mild LLQ TTP without guarding, no rebound tenderness, +BS Ext: Warm, no chronic skin changes, no edema Pelvic exam: normal external genitalia, vulva, vagina, cervix, uterus and adnexa, CERVIX: normal appearing cervix without discharge or lesions, no discharge noted, cervical motion tenderness absent, ADNEXA: normal adnexa in size, nontender and no masses, PAP: Pap smear done today.    A/P: 31 y.o. female p/w depression, LLQ pain -See problem list -f/u in 1 month

## 2011-12-12 NOTE — Patient Instructions (Addendum)
It was to see you today. I'm glad that you're mood is doing a little bit better. I'm sorry that you're having some pelvic pain. Based on the labs that they did the other day, it looks like you may have bacterial vaginosis. We will treat you again with another 7 days of antibiotics. It may also be that you are getting ready to start your period. Come back and see me in one month so we can check on your mood and your pelvic pain.

## 2011-12-13 ENCOUNTER — Encounter: Payer: Self-pay | Admitting: Family Medicine

## 2011-12-13 NOTE — Assessment & Plan Note (Signed)
Pap smear done today

## 2011-12-13 NOTE — Assessment & Plan Note (Signed)
LLQ pain, wet prep done in ED 2 days ago showing moderate clue cells, will treat for BV. No CMT, cervix non-friable in appearance, GC/Chlam negative so no need to repeat today.Pt instructed to f/u if pain does not improve, but has already gotten better since being seen 2 days ago.  Unlikely to be ovarian cyst or torsian with normal transvag U/S with normal doppler flow at that time.

## 2011-12-13 NOTE — Assessment & Plan Note (Addendum)
Appears to be doing much better, PHQ-9 score: 7.  Continue Celexa at current dose.  Patient tolerating medicine well.  She has not called Dr. Pascal Lux but still has the information. No SI/HI.  Would continue Celexa x6 months and can do trial off of it since likely triggered by situation of TAB if pt desires.

## 2012-01-08 ENCOUNTER — Encounter: Payer: Self-pay | Admitting: Family Medicine

## 2012-01-08 ENCOUNTER — Ambulatory Visit (INDEPENDENT_AMBULATORY_CARE_PROVIDER_SITE_OTHER): Payer: Managed Care, Other (non HMO) | Admitting: Family Medicine

## 2012-01-08 VITALS — BP 114/82 | HR 68 | Temp 98.4°F | Ht 71.0 in | Wt 201.6 lb

## 2012-01-08 DIAGNOSIS — R102 Pelvic and perineal pain: Secondary | ICD-10-CM

## 2012-01-08 DIAGNOSIS — N760 Acute vaginitis: Secondary | ICD-10-CM

## 2012-01-08 DIAGNOSIS — N949 Unspecified condition associated with female genital organs and menstrual cycle: Secondary | ICD-10-CM

## 2012-01-08 LAB — POCT WET PREP (WET MOUNT): Clue Cells Wet Prep Whiff POC: POSITIVE

## 2012-01-08 LAB — POCT URINALYSIS DIPSTICK
Leukocytes, UA: NEGATIVE
Spec Grav, UA: 1.025
pH, UA: 7.5

## 2012-01-08 LAB — POCT UA - MICROSCOPIC ONLY

## 2012-01-08 NOTE — Patient Instructions (Signed)
Leah Dominguez,  Thank you for coming in to see me today. Please be assured that there is no life-threatening issue going on. May continue to take the pain medications at home as needed.  In addition I recommend doing some form of exercise that'll increase her heart rate and breathing rate. This was found to be helpful with menstrual related pain.   Please followup with your PCP as needed for regular physicals.  Dr. Armen Pickup

## 2012-01-08 NOTE — Progress Notes (Signed)
Subjective:     Patient ID: Leah Dominguez, female   DOB: [birth year], 31 y.o.   MRN: [id number]  HPI Patient presents with 3 days of RLQ pain. Pain is non radiating, moderate to severe in quality, and temporarily relieved by a Vicodin she took. She denies N/V, diarrhea, constipation, blood in stools and fever. She reports scant, dark red/brown vaginal discharge since her abortion in October. She is sexually active, no condoms. Leah Dominguez for contraception.   Reviewed recent office visits. Patient has BV. She has chosen not to take the treatment.   Review of Systems As per HPI    Objective:   Physical Exam BP 114/82  Pulse 68  Temp(Src) 98.4 F (36.9 C) (Oral)  Ht 5\' 11"  (1.803 m)  Wt 201 lb 9.6 oz (91.445 kg)  BMI 28.12 kg/m2 General appearance: alert, cooperative and no distress Abdomen: soft, non-tender; bowel sounds normal; no masses,  no organomegaly. No rebound or guarding.  Pelvic: cervix normal in appearance, external genitalia normal, no adnexal masses or tenderness, no cervical motion tenderness, rectovaginal septum normal, uterus normal size, shape, and consistency and vagina normal without discharge. Some RLQ  and R adnexal discomfort but no significant tenderness. Psych: patient states that her mood is fine.       Assessment:         Plan:

## 2012-01-08 NOTE — Assessment & Plan Note (Signed)
Persistent LLQ pain. No CMT. No significant  LLQ or adnexal tenderness. As previously mentioned, normal U/S rules out ovarian torsion. No fever to suggest PID. Considered Mittelschmerz (mid cycle) pain. Reassured patient (negative UA, Upreg and wet prep with only clue cells).

## 2012-02-18 ENCOUNTER — Ambulatory Visit (INDEPENDENT_AMBULATORY_CARE_PROVIDER_SITE_OTHER): Payer: Managed Care, Other (non HMO) | Admitting: Obstetrics and Gynecology

## 2012-02-18 ENCOUNTER — Encounter: Payer: Self-pay | Admitting: Gastroenterology

## 2012-02-18 ENCOUNTER — Encounter: Payer: Self-pay | Admitting: Obstetrics and Gynecology

## 2012-02-18 VITALS — BP 119/74 | HR 76 | Ht 71.0 in | Wt 201.0 lb

## 2012-02-18 DIAGNOSIS — R1032 Left lower quadrant pain: Secondary | ICD-10-CM

## 2012-02-18 DIAGNOSIS — N912 Amenorrhea, unspecified: Secondary | ICD-10-CM

## 2012-02-18 DIAGNOSIS — G8929 Other chronic pain: Secondary | ICD-10-CM | POA: Insufficient documentation

## 2012-02-18 LAB — POCT URINE PREGNANCY: Preg Test, Ur: NEGATIVE

## 2012-02-18 MED ORDER — DESOGEST-ETH ESTRAD TRIPHASIC 0.1/0.125/0.15 -0.025 MG PO TABS: 1.0000 | ORAL_TABLET | Freq: Every day | ORAL | Status: DC

## 2012-02-18 MED ORDER — NORGESTIM-ETH ESTRAD TRIPHASIC 0.18/0.215/0.25 MG-25 MCG PO TABS: 1.0000 | ORAL_TABLET | Freq: Every day | ORAL | Status: DC

## 2012-02-18 NOTE — Progress Notes (Signed)
31 yo [id number] who presents today for evaluation of LLQ pain. Patient states she noted the onset of the pain following surgical abortion in October. Patient describes the pain as sharp, sudden onset and gradually goes away or become bearable over a few days. She denies any aggravating factors. The pain is non-radiating. She denies any fevers, chills. She denies nausea or emesis. Patient does reports some constipation and states that following a bowel movement she at times feels better. She denies any urinary complaints. Patient also reports being on birth control following her procedure. Patient reports daily spotting and no true menses even during the placebo period. Patient requesting a change in her birth control  Past Medical History  Diagnosis Date  . Hypertension     PIH with last pregnancy  . Pregnancy induced hypertension   . Bacterial vaginosis    Past Surgical History  Procedure Date  . No past surgeries   . Induced abortion 08/2011   Family History  Problem Relation Age of Onset  . Anesthesia problems Neg Hx    History  Substance Use Topics  . Smoking status: Never Smoker   . Smokeless tobacco: Never Used  . Alcohol Use: No   GENERAL: Well-developed, well-nourished female in no acute distress.  ABDOMEN: Soft, nontender, nondistended. No organomegaly. PELVIC: Normal external female genitalia. Vagina is pink and rugated.  Normal discharge. Normal appearing cervix. Uterus is normal in size. No adnexal mass or tenderness. EXTREMITIES: No cyanosis, clubbing, or edema, 2+ distal pulses.  A/P 31 yo [id number] with chronic LLQ pain and irregular bleeding - Patient had a normal pelvic ultrasound with ? Polyp - Will refer to GI for evaluation of LLQ pain with constipation. Unlikely GYN origin in view of normal ultrasound and benign pelvic exam today - OCP changed. Patient advised to return if daily spotting persists on new OCP.

## 2012-02-18 NOTE — Progress Notes (Signed)
Patient is here today for pelvic pain that she has been having off and on for several months now.  It started following an abortion procedure that she had in October and her periods have not returned.  She has had some spotting. Urine pregnancy test today is negative.

## 2012-02-20 ENCOUNTER — Encounter: Payer: Self-pay | Admitting: *Deleted

## 2012-02-20 ENCOUNTER — Other Ambulatory Visit: Payer: Self-pay | Admitting: Family Medicine

## 2012-02-25 ENCOUNTER — Encounter: Payer: Self-pay | Admitting: Gastroenterology

## 2012-02-25 ENCOUNTER — Ambulatory Visit (INDEPENDENT_AMBULATORY_CARE_PROVIDER_SITE_OTHER): Payer: Managed Care, Other (non HMO) | Admitting: Gastroenterology

## 2012-02-25 ENCOUNTER — Other Ambulatory Visit (INDEPENDENT_AMBULATORY_CARE_PROVIDER_SITE_OTHER): Payer: Managed Care, Other (non HMO)

## 2012-02-25 VITALS — BP 110/70 | HR 76 | Ht 71.0 in | Wt 201.4 lb

## 2012-02-25 DIAGNOSIS — R1032 Left lower quadrant pain: Secondary | ICD-10-CM

## 2012-02-25 LAB — FERRITIN: Ferritin: 23.6 ng/mL (ref 10.0–291.0)

## 2012-02-25 LAB — BASIC METABOLIC PANEL
BUN: 13 mg/dL (ref 6–23)
Calcium: 9 mg/dL (ref 8.4–10.5)
Creatinine, Ser: 0.7 mg/dL (ref 0.4–1.2)
GFR: 133.36 mL/min (ref >60.00)

## 2012-02-25 LAB — CBC WITH DIFFERENTIAL/PLATELET
Basophils Absolute: 0 10*3/uL (ref 0.0–0.1)
Eosinophils Relative: 0.4 % (ref 0.0–5.0)
HCT: 39.8 % (ref 36.0–46.0)
Hemoglobin: 13.6 g/dL (ref 12.0–15.0)
Lymphocytes Relative: 36.5 % (ref 12.0–46.0)
Lymphs Abs: 1.8 10*3/uL (ref 0.7–4.0)
Monocytes Relative: 6.6 % (ref 3.0–12.0)
Neutro Abs: 2.8 10*3/uL (ref 1.4–7.7)
RDW: 13.1 % (ref 11.5–14.6)
WBC: 5 10*3/uL (ref 4.5–10.5)

## 2012-02-25 LAB — HEPATIC FUNCTION PANEL
AST: 12 U/L (ref 0–37)
Albumin: 3.7 g/dL (ref 3.5–5.2)
Total Bilirubin: 0.7 mg/dL (ref 0.3–1.2)

## 2012-02-25 LAB — HIGH SENSITIVITY CRP: CRP, High Sensitivity: 12.09 mg/L — ABNORMAL HIGH (ref 0.000–5.000)

## 2012-02-25 LAB — TSH: TSH: 1.36 u[IU]/mL (ref 0.35–5.50)

## 2012-02-25 LAB — FOLATE: Folate: 7.9 ng/mL (ref >5.9)

## 2012-02-25 LAB — IBC PANEL
Iron: 136 ug/dL (ref 42–145)
Saturation Ratios: 34.9 % (ref 20.0–50.0)
Transferrin: 278.3 mg/dL (ref 212.0–360.0)

## 2012-02-25 MED ORDER — MOVIPREP 100 G PO SOLR: 1.0000 | Freq: Once | ORAL | Status: DC

## 2012-02-25 MED ORDER — HYOSCYAMINE SULFATE 0.125 MG SL SUBL: 0.1250 mg | SUBLINGUAL_TABLET | SUBLINGUAL | Status: DC | PRN

## 2012-02-25 MED ORDER — TRAMADOL HCL 50 MG PO TABS: 50.0000 mg | ORAL_TABLET | Freq: Two times a day (BID) | ORAL | Status: AC

## 2012-02-25 NOTE — Patient Instructions (Signed)
Your procedure has been scheduled for 02/28/2012, please follow the seperate instructions.  Your prescription(s) have been sent to you pharmacy.  Please go to the basement today for your labs.  Take Levsin as needed for abdominal cramping. Take Ultram twice a day as needed for abdominal pain.  Follow the High Fiber diet below.   High Fiber Diet A high fiber diet changes your normal diet to include more whole grains, legumes, fruits, and vegetables. Changes in the diet involve replacing refined carbohydrates with unrefined foods. The calorie level of the diet is essentially unchanged. The Dietary Reference Intake (recommended amount) for adult males is 38 g per day. For adult females, it is 25 g per day. Pregnant and lactating women should consume 28 g of fiber per day. Fiber is the intact part of a plant that is not broken down during digestion. Functional fiber is fiber that has been isolated from the plant to provide a beneficial effect in the body. PURPOSE  Increase stool bulk.   Ease and regulate bowel movements.   Lower cholesterol.  INDICATIONS THAT YOU NEED MORE FIBER  Constipation and hemorrhoids.   Uncomplicated diverticulosis (intestine condition) and irritable bowel syndrome.   Weight management.   As a protective measure against hardening of the arteries (atherosclerosis), diabetes, and cancer.  NOTE OF CAUTION If you have a digestive or bowel problem, ask your caregiver for advice before adding high fiber foods to your diet. Some of the following medical problems are such that a high fiber diet should not be used without consulting your caregiver:  Acute diverticulitis (intestine infection).   Partial small bowel obstructions.   Complicated diverticular disease involving bleeding, rupture (perforation), or abscess (boil, furuncle).   Presence of autonomic neuropathy (nerve damage) or gastric paresis (stomach cannot empty itself).  GUIDELINES FOR INCREASING  FIBER  Start adding fiber to the diet slowly. A gradual increase of about 5 more grams (2 slices of whole-wheat bread, 2 servings of most fruits or vegetables, or 1 bowl of high fiber cereal) per day is best. Too rapid an increase in fiber may result in constipation, flatulence, and bloating.   Drink enough water and fluids to keep your urine clear or pale yellow. Water, juice, or caffeine-free drinks are recommended. Not drinking enough fluid may cause constipation.   Eat a variety of high fiber foods rather than one type of fiber.   Try to increase your intake of fiber through using high fiber foods rather than fiber pills or supplements that contain small amounts of fiber.   The goal is to change the types of food eaten. Do not supplement your present diet with high fiber foods, but replace foods in your present diet.  INCLUDE A VARIETY OF FIBER SOURCES  Replace refined and processed grains with whole grains, canned fruits with fresh fruits, and incorporate other fiber sources. White rice, white breads, and most bakery goods contain little or no fiber.   Brown whole-grain rice, buckwheat oats, and many fruits and vegetables are all good sources of fiber. These include: broccoli, Brussels sprouts, cabbage, cauliflower, beets, sweet potatoes, white potatoes (skin on), carrots, tomatoes, eggplant, squash, berries, fresh fruits, and dried fruits.   Cereals appear to be the richest source of fiber. Cereal fiber is found in whole grains and bran. Bran is the fiber-rich outer coat of cereal grain, which is largely removed in refining. In whole-grain cereals, the bran remains. In breakfast cereals, the largest amount of fiber is found in those with "  bran" in their names. The fiber content is sometimes indicated on the label.   You may need to include additional fruits and vegetables each day.   In baking, for 1 cup white flour, you may use the following substitutions:   1 cup whole-wheat flour  minus 2 tbs.    cup white flour plus  cup whole-wheat flour.  Document Released: 11/11/2005 Document Revised: 10/31/2011 Document Reviewed: 09/19/2009 [facility] Patient Information 2012 [place], [place].

## 2012-02-25 NOTE — Progress Notes (Signed)
History of Present Illness:  This is a very pleasant 31 year old African American female referred by GYN evaluation of 2 months of rather persistent daily sharp left lower quadrant pain without specific precipitating or alleviating elements. She has tried hydrocodone with only mild relief. Her pain occurs daily but does not awaken her from sleep, can be severe to a grade of 9/10, is not associated with bowel movements, eating, and there are no systemic plane such as fever, chills, nausea or vomiting. Her appetite remains good and she has had no weight loss. Recent pelvic ultrasound was unremarkable. She has used Advil and Anaprox for pain. Patient has mild depression treated with Celexa 20 mg a day. Family history is noncontributory. She specifically denies melena, hematochezia, upper GI or hepatobiliary complaints. She has not had prior GI evaluations, endoscopic exams or barium studies.  I have reviewed this patient's present history, medical and surgical past history, allergies and medications.     ROS: The remainder of the 10 point ROS is negative     Physical Exam: Blood pressure 118/64 and pulse 60 and regular. BMI of 31.96. General well developed well nourished patient in no acute distress, appearing her stated age, multiple tattoos visible Eyes PERRLA, no icterus, fundoscopic exam per opthamologist Skin no lesions noted Neck supple, no adenopathy, no thyroid enlargement, no tenderness Chest clear to percussion and auscultation Heart no significant murmurs, gallops or rubs noted Abdomen no hepatosplenomegaly masses or tenderness, BS normal.  Rectal inspection normal no fissures, or fistulae noted.  No masses or tenderness on digital exam. Stool guaiac negative. There is significant tenderness over her sigmoid colon in the left lower quadrant area without rebound tenderness. I cannot appreciate any definite mass in this area. Bowel sounds are not obstructive in nature. Extremities no acute  joint lesions, edema, phlebitis or evidence of cellulitis. Neurologic patient oriented x 3, cranial nerves intact, no focal neurologic deficits noted. Psychological mental status normal and normal affect.  Assessment and plan: Unusual situation of continued rather severe left lower quadrant pain and a relatively young patient otherwise without GI issues. Her tenderness is over her sigmoid colon, and I have scheduled her for lab review and colonoscopy with propofol sedation. Also we have prescribed when necessary tramadol 50 mg every 6-8 hours, and when necessary sublingual Levsin. She has been placed on a high fiber diet with daily Metamucil and liberal by mouth fluids. We need to exclude complicated diverticular disease, inflammatory bowel disease, or colon cancer.  Encounter Diagnosis  Name Primary?  Marland Kitchen LLQ pain Yes

## 2012-02-28 ENCOUNTER — Encounter: Payer: Self-pay | Admitting: Gastroenterology

## 2012-02-28 ENCOUNTER — Ambulatory Visit (AMBULATORY_SURGERY_CENTER): Payer: Managed Care, Other (non HMO) | Admitting: Gastroenterology

## 2012-02-28 VITALS — BP 116/74 | HR 83 | Temp 97.3°F | Resp 20 | Ht 71.0 in | Wt 201.0 lb

## 2012-02-28 DIAGNOSIS — R1032 Left lower quadrant pain: Secondary | ICD-10-CM

## 2012-02-28 MED ORDER — SODIUM CHLORIDE 0.9 % IV SOLN: 500.0000 mL | INTRAVENOUS | Status: DC

## 2012-02-28 NOTE — Patient Instructions (Signed)

## 2012-02-28 NOTE — Progress Notes (Signed)
Patient did not have preoperative order for IV antibiotic SSI prophylaxis. (G8918)  Patient did not experience any of the following events: a burn prior to discharge; a fall within the facility; wrong site/side/patient/procedure/implant event; or a hospital transfer or hospital admission upon discharge from the facility. (G8907)  

## 2012-02-28 NOTE — Progress Notes (Signed)
Addended by: Ginette Pitman on: 02/28/2012 03:59 PM   Modules accepted: Orders

## 2012-02-28 NOTE — Op Note (Signed)
[facility] [address] [place], [place]  [address]  COLONOSCOPY PROCEDURE REPORT  PATIENT:  Leah Dominguez, Leah Dominguez  MR#:  [id number] BIRTHDATE:  1981-02-03, 31 yrs. old  GENDER:  female ENDOSCOPIST:  Vania Rea. Jarold Motto, MD, [facility] REF. BY:  Demetria Pore, M.D. PROCEDURE DATE:  02/28/2012 PROCEDURE:  Diagnostic Colonoscopy ASA CLASS:  Class II INDICATIONS:  LLQ PAIN ?? ETIOLOGY,NEGATIVE W/U. MEDICATIONS:   propofol (Diprivan) 300 mg IV  DESCRIPTION OF PROCEDURE:   After the risks and benefits and of the procedure were explained, informed consent was obtained. Digital rectal exam was performed and revealed no abnormalities. The LB 180AL [id number] endoscope was introduced through the anus and advanced to the cecum, which was identified by both the appendix and ileocecal valve.  The quality of the prep was poor, using MiraLax.  The instrument was then slowly withdrawn as the colon was fully examined. <<PROCEDUREIMAGES>>  FINDINGS:  The prep was not adequate to allow appropriate inspection of the mucosa. .  This was otherwise a normal examination of the colon.  No polyps or cancers were seen.   Retroflexed views in the rectum revealed no abnormalities.    The scope was then withdrawn from the patient and the procedure completed.  COMPLICATIONS:  None ENDOSCOPIC IMPRESSION: 1) Poor prep 2) Otherwise normal examination 3) No polyps or cancers PROBABLE FUNCTIONAL PROBLEMS."CHRONIC PELVIC PAIN SYBDROME" RECOMMENDATIONS: F/U 1 CARE,DOUBT GI ETIOLOGY OF PAIN SYNDROME.  REPEAT EXAM:  No  ______________________________ Vania Rea. Jarold Motto, MD, [facility]  CC:  n. eSIGNED:   Vania Rea. Patterson at 02/28/2012 03:13 PM  Carlena Sax, [id number]

## 2012-03-02 ENCOUNTER — Telehealth: Payer: Self-pay | Admitting: *Deleted

## 2012-03-02 NOTE — Telephone Encounter (Signed)
  Follow up Call-  Call back number 02/28/2012  Permission to leave phone message Yes     Patient questions:  Do you have a fever, pain , or abdominal swelling? yes Pain Score  3 *same as came in with  Have you tolerated food without any problems? yes  Have you been able to return to your normal activities? yes  Do you have any questions about your discharge instructions: Diet   no Medications  no Follow up visit  no  Do you have questions or concerns about your Care? no  Actions: * If pain score is 4 or above: No action needed, pain <4.

## 2012-03-10 ENCOUNTER — Ambulatory Visit: Payer: Managed Care, Other (non HMO) | Admitting: Family Medicine

## 2012-03-27 ENCOUNTER — Ambulatory Visit (INDEPENDENT_AMBULATORY_CARE_PROVIDER_SITE_OTHER): Payer: Managed Care, Other (non HMO) | Admitting: Family Medicine

## 2012-03-27 ENCOUNTER — Encounter: Payer: Self-pay | Admitting: Family Medicine

## 2012-03-27 VITALS — BP 110/76 | HR 81 | Temp 98.8°F | Ht 71.0 in | Wt 202.0 lb

## 2012-03-27 DIAGNOSIS — E538 Deficiency of other specified B group vitamins: Secondary | ICD-10-CM

## 2012-03-27 MED ORDER — CYANOCOBALAMIN 1000 MCG/ML IJ SOLN
1000.0000 ug | Freq: Once | INTRAMUSCULAR | Status: AC
  Administered 2012-03-27: 1000 ug via INTRAMUSCULAR

## 2012-03-27 MED ORDER — VITAMIN B-12 1000 MCG PO TABS: 1000.0000 ug | ORAL_TABLET | Freq: Every day | ORAL | Status: DC

## 2012-03-27 NOTE — Patient Instructions (Signed)
Thank you for coming in today. I do not know why you have low B12.  We will treat it with a shot today and pills daily.  Please come back in 1 month for a recheck with your regular doctor. Dr. Fara Boros.

## 2012-03-27 NOTE — Assessment & Plan Note (Signed)
Patient is B12 deficient without a clear explanation.  I wonder if she has a problem with her ileum that would explain her abdominal pain and her B12 deficiency. Regardless I will given IM injection of 1000 mcg of B12 today and start patient on 1000 mcg of oral B12 daily. Have encouraged her to followup with her primary care doctor within one month. She expresses understanding.

## 2012-03-27 NOTE — Progress Notes (Signed)
Leah Dominguez is a 31 y.o. female who presents to [facility] today for B12 deficiency. Patient is currently being worked up for abdominal pain by her primary care doctor as well as gastroenterologist. She recently had a B12 level drawn which was found to be deficient 63. She denies any restricted diet, acid blocking medications, belly surgeries, or bypass surgeries. She has never been diagnosed with B12 deficiency before. Aside from her abdominal complaints she feels well without any fevers or chills.   PMH: Reviewed significant for abdominal pain currently being worked up History  Substance Use Topics  . Smoking status: Never Smoker   . Smokeless tobacco: Never Used  . Alcohol Use: No   ROS as above  Medications reviewed. Current Outpatient Prescriptions  Medication Sig Dispense Refill  . acetaminophen (TYLENOL) 325 MG tablet Take 650 mg by mouth every 6 (six) hours as needed.      . citalopram (CELEXA) 20 MG tablet Take 20 mg by mouth daily.      Marland Kitchen desogestrel-ethinyl estradiol (VELIVET,CAZIANT,CESIA,CYCLESSA) 0.1/0.125/0.15 -0.025 MG tablet Take 1 tablet by mouth daily.      Marland Kitchen ibuprofen (ADVIL,MOTRIN) 100 MG/5ML suspension Take 200 mg by mouth every 4 (four) hours as needed.      . LO LOESTRIN FE 1 MG-10 MCG / 10 MCG tablet       . naproxen sodium (ANAPROX) 550 MG tablet Take 550 mg by mouth 2 (two) times daily with a meal.      . hyoscyamine (LEVSIN SL) 0.125 MG SL tablet Place 1 tablet (0.125 mg total) under the tongue as needed for cramping.  90 tablet  3  . vitamin B-12 (CYANOCOBALAMIN) 1000 MCG tablet Take 1 tablet (1,000 mcg total) by mouth daily.  30 tablet  3    Exam:  BP 110/76  Pulse 81  Temp(Src) 98.8 F (37.1 C) (Oral)  Ht 5\' 11"  (1.803 m)  Wt 202 lb (91.627 kg)  BMI 28.17 kg/m2 Gen: Well NAD Abd: NABS, NT, ND  Lab Results  Component Value Date   VITAMINB12 63* 02/25/2012

## 2012-03-27 NOTE — Progress Notes (Signed)
Addended by: Jennette Bill on: 03/27/2012 09:52 AM   Modules accepted: Orders

## 2012-03-31 ENCOUNTER — Ambulatory Visit: Payer: Managed Care, Other (non HMO) | Admitting: Family Medicine

## 2012-04-25 ENCOUNTER — Emergency Department (HOSPITAL_BASED_OUTPATIENT_CLINIC_OR_DEPARTMENT_OTHER)
Admission: EM | Admit: 2012-04-25 | Discharge: 2012-04-26 | Disposition: A | Discharge status: Home / Self Care | Payer: Self-pay | Attending: Emergency Medicine | Admitting: Emergency Medicine

## 2012-04-25 ENCOUNTER — Encounter (HOSPITAL_BASED_OUTPATIENT_CLINIC_OR_DEPARTMENT_OTHER): Payer: Self-pay | Admitting: *Deleted

## 2012-04-25 DIAGNOSIS — N760 Acute vaginitis: Secondary | ICD-10-CM | POA: Insufficient documentation

## 2012-04-25 DIAGNOSIS — B9689 Other specified bacterial agents as the cause of diseases classified elsewhere: Secondary | ICD-10-CM

## 2012-04-25 DIAGNOSIS — N949 Unspecified condition associated with female genital organs and menstrual cycle: Secondary | ICD-10-CM | POA: Insufficient documentation

## 2012-04-25 DIAGNOSIS — R10817 Generalized abdominal tenderness: Secondary | ICD-10-CM | POA: Insufficient documentation

## 2012-04-25 DIAGNOSIS — A499 Bacterial infection, unspecified: Secondary | ICD-10-CM | POA: Insufficient documentation

## 2012-04-25 DIAGNOSIS — R102 Pelvic and perineal pain: Secondary | ICD-10-CM

## 2012-04-25 LAB — URINALYSIS, ROUTINE W REFLEX MICROSCOPIC
Bilirubin Urine: NEGATIVE
Glucose, UA: NEGATIVE mg/dL
Ketones, ur: NEGATIVE mg/dL
Nitrite: NEGATIVE
Protein, ur: NEGATIVE mg/dL
Specific Gravity, Urine: 1.028 (ref 1.005–1.030)
Urobilinogen, UA: 1 mg/dL (ref 0.0–1.0)
pH: 6.5 (ref 5.0–8.0)

## 2012-04-25 LAB — PREGNANCY, URINE: Preg Test, Ur: NEGATIVE

## 2012-04-25 LAB — URINE MICROSCOPIC-ADD ON

## 2012-04-25 MED ORDER — KETOROLAC TROMETHAMINE 30 MG/ML IJ SOLN
30.0000 mg | Freq: Once | INTRAMUSCULAR | Status: AC
  Administered 2012-04-26: 30 mg via INTRAMUSCULAR
  Filled 2012-04-25: qty 1

## 2012-04-25 MED ORDER — HYDROCODONE-ACETAMINOPHEN 5-325 MG PO TABS
1.0000 | ORAL_TABLET | Freq: Once | ORAL | Status: AC
  Administered 2012-04-26: 1 via ORAL
  Filled 2012-04-25: qty 1

## 2012-04-25 NOTE — ED Notes (Signed)
Pt with c/o generalized abd pain x 3 hours- denies n/v/d- reports vag d/c

## 2012-04-25 NOTE — ED Provider Notes (Signed)
History   This chart was scribed for Leah Dominguez. Leah Lamas, MD by Leah Dominguez. The patient was seen in room MH08/MH08. Patient's care was started at 2020.   CSN: [id number]  Arrival date & time 04/25/12  2020   First MD Initiated Contact with Patient 04/25/12 2258      Chief Complaint  Patient presents with  . Abdominal Pain    (Consider location/radiation/quality/duration/timing/severity/associated sxs/prior treatment) Patient is a 31 y.o. female presenting with abdominal pain.  Abdominal Pain The primary symptoms of the illness include abdominal pain and vaginal discharge. The primary symptoms of the illness do not include fever, shortness of breath, nausea, vomiting, diarrhea or dysuria.  The vaginal discharge is not associated with dysuria.   Symptoms associated with the illness do not include chills, urgency or back pain.    Leah Dominguez is a 31 y.o. female who presents to the Emergency Department complaining of abdominal pain onset a few hours ago tonight. Pt says the pain waxes and wanes and feels like cramping. Pt says the pain has been present for a couple of months but hasn't been as serious as tonight. Pt says slight vaginal discharge that is new. Denies nausea, diarrhea. Patient healthy otherwise. Pt on oral birth control with current boyfriend. Patient had abortion in 08/2011, hasn't had regular menstrual cycle since, and has seen her Ob/gyn who put her on medication. Has taken Ibuprofen without relief   Ob/GYN - [facility].   Past Medical History  Diagnosis Date  . Hypertension     PIH with last pregnancy  . Bacterial vaginosis   . Anxiety and depression     Past Surgical History  Procedure Date  . No past surgeries   . Induced abortion 08/2011    Family History  Problem Relation Age of Onset  . Anesthesia problems Neg Hx   . Diabetes Maternal Grandmother   . Diabetes Maternal Uncle     History  Substance Use Topics  . Smoking status:  Never Smoker   . Smokeless tobacco: Never Used  . Alcohol Use: No    OB History    Grav Para Term Preterm Abortions TAB SAB Ect Mult Living   7 2 2  0 4 3 1  0 0 2      Review of Systems  Constitutional: Negative for fever, chills and appetite change.  Respiratory: Negative for shortness of breath.   Cardiovascular: Negative for chest pain.  Gastrointestinal: Positive for abdominal pain. Negative for nausea, vomiting, diarrhea and blood in stool.  Genitourinary: Positive for vaginal discharge and pelvic pain. Negative for dysuria, urgency, flank pain and genital sores.  Musculoskeletal: Negative for back pain.  Skin: Negative for color change and rash.  All other systems reviewed and are negative.    Allergies  Review of patient's allergies indicates no known allergies.  Home Medications   Current Outpatient Rx  Name Route Sig Dispense Refill  . CITALOPRAM HYDROBROMIDE 20 MG PO TABS Oral Take 20 mg by mouth daily.    . DESOGEST-ETH ESTRAD TRIPHASIC 0.1/0.125/0.15 -0.025 MG PO TABS Oral Take 1 tablet by mouth daily.    Marland Kitchen HYOSCYAMINE SULFATE 0.125 MG SL SUBL Sublingual Place 0.125 mg under the tongue every 4 (four) hours as needed. For cramping    . IBUPROFEN 200 MG PO TABS Oral Take 400 mg by mouth every 6 (six) hours as needed. For pain    . VITAMIN B-12 1000 MCG PO TABS Oral Take 1 tablet (1,000  mcg total) by mouth daily. 30 tablet 3  . HYDROCODONE-ACETAMINOPHEN 5-325 MG PO TABS  1-2 tablets po q 6 hours prn moderate to severe pain 20 tablet 0  . METRONIDAZOLE 500 MG PO TABS Oral Take 1 tablet (500 mg total) by mouth 2 (two) times daily. 14 tablet 0    BP 123/91  Pulse 80  Temp(Src) 98.4 F (36.9 C) (Oral)  Resp 20  Ht 5\' 11"  (1.803 m)  Wt 190 lb (86.183 kg)  BMI 26.50 kg/m2  SpO2 100%  LMP 04/18/2012  Physical Exam  Nursing note and vitals reviewed. Constitutional: She is oriented to person, place, and time. She appears well-developed and well-nourished.  HENT:    Head: Normocephalic and atraumatic.  Eyes: Conjunctivae and EOM are normal.  Neck: Normal range of motion. Neck supple.  Cardiovascular: Normal rate and regular rhythm.   Pulmonary/Chest: Effort normal and breath sounds normal.  Abdominal: Soft. Bowel sounds are normal. She exhibits no distension. There is generalized tenderness. There is no rebound and no guarding.  Genitourinary: Pelvic exam was performed with patient prone. Uterus is tender. Cervix exhibits discharge and friability. Right adnexum displays no mass and no tenderness. Left adnexum displays tenderness. Left adnexum displays no mass. No tenderness or bleeding around the vagina. No foreign body around the vagina. Vaginal discharge found.  Musculoskeletal: Normal range of motion.  Neurological: She is alert and oriented to person, place, and time.  Skin: Skin is warm and dry.  Psychiatric: She has a normal mood and affect.    ED Course  Procedures (including critical care time) DIAGNOSTIC STUDIES: Oxygen Saturation is 100% on room air, normal by my interpretation.    COORDINATION OF CARE: 2300 Patient informed of current plan for treatment and evaluation and agrees with plan at this time.      Labs Reviewed  URINALYSIS, ROUTINE W REFLEX MICROSCOPIC - Abnormal; Notable for the following:    APPearance CLOUDY (*)    Hgb urine dipstick TRACE (*)    Leukocytes, UA TRACE (*)    All other components within normal limits  URINE MICROSCOPIC-ADD ON - Abnormal; Notable for the following:    Squamous Epithelial / LPF FEW (*)    Bacteria, UA FEW (*)    All other components within normal limits  WET PREP, GENITAL - Abnormal; Notable for the following:    Clue Cells Wet Prep HPF POC MANY (*)    WBC, Wet Prep HPF POC MANY (*)    All other components within normal limits  PREGNANCY, URINE  GC/CHLAMYDIA PROBE AMP, GENITAL   No results found.   1. Pelvic pain   2. Bacterial vaginosis       MDM  I personally performed  the services described in this documentation, which was scribed in my presence. The recorded information has been reviewed and considered.   Pt with adnexal tenderness mild on left, but no guard or rebound, no vomiting, doubt torsion clinically.  Pt's pain is intermittent again, going aginst torsion, possibly cyst. Also BV on wet prep, cultures for GC/Chly sent.  Pt has PCP to follow up with.  Will order U/S for tomorrow and pt is agreeable.  Rx for flagyl and norco.     Leah Dominguez. Leah Lamas, MD 04/26/12 1610

## 2012-04-26 ENCOUNTER — Ambulatory Visit (HOSPITAL_BASED_OUTPATIENT_CLINIC_OR_DEPARTMENT_OTHER)
Admission: RE | Admit: 2012-04-26 | Discharge: 2012-04-26 | Disposition: A | Discharge status: Home / Self Care | Payer: Managed Care, Other (non HMO) | Source: Ambulatory Visit | Attending: Emergency Medicine | Admitting: Emergency Medicine

## 2012-04-26 ENCOUNTER — Other Ambulatory Visit (HOSPITAL_BASED_OUTPATIENT_CLINIC_OR_DEPARTMENT_OTHER): Payer: Self-pay | Admitting: Emergency Medicine

## 2012-04-26 DIAGNOSIS — R52 Pain, unspecified: Secondary | ICD-10-CM

## 2012-04-26 DIAGNOSIS — R1032 Left lower quadrant pain: Secondary | ICD-10-CM | POA: Insufficient documentation

## 2012-04-26 LAB — WET PREP, GENITAL
Trich, Wet Prep: NONE SEEN
Yeast Wet Prep HPF POC: NONE SEEN

## 2012-04-26 MED ORDER — FLUCONAZOLE 50 MG PO TABS
ORAL_TABLET | ORAL | Status: AC
  Filled 2012-04-26: qty 1

## 2012-04-26 MED ORDER — METRONIDAZOLE 500 MG PO TABS: 500.0000 mg | ORAL_TABLET | Freq: Two times a day (BID) | ORAL | Status: AC

## 2012-04-26 MED ORDER — METRONIDAZOLE 500 MG PO TABS
500.0000 mg | ORAL_TABLET | Freq: Once | ORAL | Status: AC
  Administered 2012-04-26: 500 mg via ORAL
  Filled 2012-04-26: qty 1

## 2012-04-26 MED ORDER — FLUCONAZOLE 50 MG PO TABS
150.0000 mg | ORAL_TABLET | Freq: Once | ORAL | Status: AC
  Administered 2012-04-26: 150 mg via ORAL

## 2012-04-26 MED ORDER — HYDROCODONE-ACETAMINOPHEN 5-325 MG PO TABS: ORAL_TABLET | ORAL | Status: AC

## 2012-04-26 MED ORDER — FLUCONAZOLE 100 MG PO TABS
ORAL_TABLET | ORAL | Status: AC
  Filled 2012-04-26: qty 1

## 2012-04-26 NOTE — Discharge Instructions (Signed)
Bacterial Vaginosis Bacterial vaginosis (BV) is a vaginal infection where the normal balance of bacteria in the vagina is disrupted. The normal balance is then replaced by an overgrowth of certain bacteria. There are several different kinds of bacteria that can cause BV. BV is the most common vaginal infection in women of childbearing age. CAUSES   The cause of BV is not fully understood. BV develops when there is an increase or imbalance of harmful bacteria.   Some activities or behaviors can upset the normal balance of bacteria in the vagina and put women at increased risk including:   Having a new sex partner or multiple sex partners.   Douching.   Using an intrauterine device (IUD) for contraception.   It is not clear what role sexual activity plays in the development of BV. However, women that have never had sexual intercourse are rarely infected with BV.  Women do not get BV from toilet seats, bedding, swimming pools or from touching objects around them.  SYMPTOMS   Grey vaginal discharge.   A fish-like odor with discharge, especially after sexual intercourse.   Itching or burning of the vagina and vulva.   Burning or pain with urination.   Some women have no signs or symptoms at all.  DIAGNOSIS  Your caregiver must examine the vagina for signs of BV. Your caregiver will perform lab tests and look at the sample of vaginal fluid through a microscope. They will look for bacteria and abnormal cells (clue cells), a pH test higher than 4.5, and a positive amine test all associated with BV.  RISKS AND COMPLICATIONS   Pelvic inflammatory disease (PID).   Infections following gynecology surgery.   Developing HIV.   Developing herpes virus.  TREATMENT  Sometimes BV will clear up without treatment. However, all women with symptoms of BV should be treated to avoid complications, especially if gynecology surgery is planned. Female partners generally do not need to be treated. However,  BV may spread between female sex partners so treatment is helpful in preventing a recurrence of BV.   BV may be treated with antibiotics. The antibiotics come in either pill or vaginal cream forms. Either can be used with nonpregnant or pregnant women, but the recommended dosages differ. These antibiotics are not harmful to the baby.   BV can recur after treatment. If this happens, a second round of antibiotics will often be prescribed.   Treatment is important for pregnant women. If not treated, BV can cause a premature delivery, especially for a pregnant woman who had a premature birth in the past. All pregnant women who have symptoms of BV should be checked and treated.   For chronic reoccurrence of BV, treatment with a type of prescribed gel vaginally twice a week is helpful.  HOME CARE INSTRUCTIONS   Finish all medication as directed by your caregiver.   Do not have sex until treatment is completed.   Tell your sexual partner that you have a vaginal infection. They should see their caregiver and be treated if they have problems, such as a mild rash or itching.   Practice safe sex. Use condoms. Only have 1 sex partner.  PREVENTION  Basic prevention steps can help reduce the risk of upsetting the natural balance of bacteria in the vagina and developing BV:  Do not have sexual intercourse (be abstinent).   Do not douche.   Use all of the medicine prescribed for treatment of BV, even if the signs and symptoms go away.     Tell your sex partner if you have BV. That way, they can be treated, if needed, to prevent reoccurrence.  SEEK MEDICAL CARE IF:   Your symptoms are not improving after 3 days of treatment.   You have increased discharge, pain, or fever.  MAKE SURE YOU:   Understand these instructions.   Will watch your condition.   Will get help right away if you are not doing well or get worse.  FOR MORE INFORMATION  Division of STD Prevention (DSTDP), Centers for Disease  Control and Prevention: [employer]Apps.[employer] American Social Health Association (ASHA): www.ashastd.org  Document Released: 11/11/2005 Document Revised: 10/31/2011 Document Reviewed: 05/04/2009 [facility] Patient Information 2012 [place], [place].   Abdominal Pain, Women Abdominal (stomach, pelvic, or belly) pain can be caused by many things. It is important to tell your doctor:  The location of the pain.   Does it come and go or is it present all the time?   Are there things that start the pain (eating certain foods, exercise)?   Are there other symptoms associated with the pain (fever, nausea, vomiting, diarrhea)?  All of this is helpful to know when trying to find the cause of the pain. CAUSES   Stomach: virus or bacteria infection, or ulcer.   Intestine: appendicitis (inflamed appendix), regional ileitis (Crohn's disease), ulcerative colitis (inflamed colon), irritable bowel syndrome, diverticulitis (inflamed diverticulum of the colon), or cancer of the stomach or intestine.   Gallbladder disease or stones in the gallbladder.   Kidney disease, kidney stones, or infection.   Pancreas infection or cancer.   Fibromyalgia (pain disorder).   Diseases of the female organs:   Uterus: fibroid (non-cancerous) tumors or infection.   Fallopian tubes: infection or tubal pregnancy.   Ovary: cysts or tumors.   Pelvic adhesions (scar tissue).   Endometriosis (uterus lining tissue growing in the pelvis and on the pelvic organs).   Pelvic congestion syndrome (female organs filling up with blood just before the menstrual period).   Pain with the menstrual period.   Pain with ovulation (producing an egg).   Pain with an IUD (intrauterine device, birth control) in the uterus.   Cancer of the female organs.   Functional pain (pain not caused by a disease, may improve without treatment).   Psychological pain.   Depression.  DIAGNOSIS  Your doctor will decide the seriousness of your  pain by doing an examination.  Blood tests.   X-rays.   Ultrasound.   CT scan (computed tomography, special type of X-ray).   MRI (magnetic resonance imaging).   Cultures, for infection.   Barium enema (dye inserted in the large intestine, to better view it with X-rays).   Colonoscopy (looking in intestine with a lighted tube).   Laparoscopy (minor surgery, looking in abdomen with a lighted tube).   Major abdominal exploratory surgery (looking in abdomen with a large incision).  TREATMENT  The treatment will depend on the cause of the pain.   Many cases can be observed and treated at home.   Over-the-counter medicines recommended by your caregiver.   Prescription medicine.   Antibiotics, for infection.   Birth control pills, for painful periods or for ovulation pain.   Hormone treatment, for endometriosis.   Nerve blocking injections.   Physical therapy.   Antidepressants.   Counseling with a psychologist or psychiatrist.   Minor or major surgery.  HOME CARE INSTRUCTIONS   Do not take laxatives, unless directed by your caregiver.   Take over-the-counter pain medicine only if  ordered by your caregiver. Do not take aspirin because it can cause an upset stomach or bleeding.   Try a clear liquid diet (broth or water) as ordered by your caregiver. Slowly move to a bland diet, as tolerated, if the pain is related to the stomach or intestine.   Have a thermometer and take your temperature several times a day, and record it.   Bed rest and sleep, if it helps the pain.   Avoid sexual intercourse, if it causes pain.   Avoid stressful situations.   Keep your follow-up appointments and tests, as your caregiver orders.   If the pain does not go away with medicine or surgery, you may try:   Acupuncture.   Relaxation exercises (yoga, meditation).   Group therapy.   Counseling.  SEEK MEDICAL CARE IF:   You notice certain foods cause stomach pain.   Your  home care treatment is not helping your pain.   You need stronger pain medicine.   You want your IUD removed.   You feel faint or lightheaded.   You develop nausea and vomiting.   You develop a rash.   You are having side effects or an allergy to your medicine.  SEEK IMMEDIATE MEDICAL CARE IF:   Your pain does not go away or gets worse.   You have a fever.   Your pain is felt only in portions of the abdomen. The right side could possibly be appendicitis. The left lower portion of the abdomen could be colitis or diverticulitis.   You are passing blood in your stools (bright red or black tarry stools, with or without vomiting).   You have blood in your urine.   You develop chills, with or without a fever.   You pass out.  MAKE SURE YOU:   Understand these instructions.   Will watch your condition.   Will get help right away if you are not doing well or get worse.  Document Released: 09/08/2007 Document Revised: 10/31/2011 Document Reviewed: 09/28/2009 [facility] Patient Information 2012 [place], [place].    Narcotic and benzodiazepine use may cause drowsiness, slowed breathing or dependence.  Please use with caution and do not drive, operate machinery or watch young children alone while taking them.  Taking combinations of these medications or drinking alcohol will potentiate these effects.

## 2012-04-26 NOTE — ED Notes (Signed)
Per request of PA, pt called and notified of [place] results and need for follow up with OB/GYN.  Pt stated she already has an MD and did not require a consult name.  Advised pt to continue taking medication as prescribed from earlier visit.  Pt stated understanding of POC

## 2012-04-27 LAB — GC/CHLAMYDIA PROBE AMP, GENITAL
Chlamydia, DNA Probe: NEGATIVE
GC Probe Amp, Genital: NEGATIVE

## 2012-04-29 ENCOUNTER — Ambulatory Visit (INDEPENDENT_AMBULATORY_CARE_PROVIDER_SITE_OTHER): Payer: Self-pay | Admitting: Family Medicine

## 2012-04-29 ENCOUNTER — Encounter: Payer: Self-pay | Admitting: Family Medicine

## 2012-04-29 VITALS — BP 120/72 | HR 78 | Temp 99.5°F | Ht 71.0 in | Wt 203.0 lb

## 2012-04-29 DIAGNOSIS — K589 Irritable bowel syndrome without diarrhea: Secondary | ICD-10-CM | POA: Insufficient documentation

## 2012-04-29 MED ORDER — DICYCLOMINE HCL 10 MG PO CAPS: 10.0000 mg | ORAL_CAPSULE | Freq: Three times a day (TID) | ORAL | Status: DC

## 2012-04-29 NOTE — Patient Instructions (Addendum)
Meds ordered this encounter  Medications  . dicyclomine (BENTYL) 10 MG capsule    Sig: Take 1 capsule (10 mg total) by mouth 4 (four) times daily -  before meals and at bedtime.    Dispense:  90 capsule    Refill:  3   Irritable Bowel Syndrome Irritable Bowel Syndrome (IBS) is caused by a disturbance of normal bowel function. Other terms used are spastic colon, mucous colitis, and irritable colon. It does not require surgery, nor does it lead to cancer. There is no cure for IBS. But with proper diet, stress reduction, and medication, you will find that your problems (symptoms) will gradually disappear or improve. IBS is a common digestive disorder. It usually appears in late adolescence or early adulthood. Women develop it twice as often as men. CAUSES   After food has been digested and absorbed in the small intestine, waste material is moved into the colon (large intestine). In the colon, water and salts are absorbed from the undigested products coming from the small intestine. The remaining residue, or fecal material, is held for elimination. Under normal circumstances, gentle, rhythmic contractions on the bowel walls push the fecal material along the colon towards the rectum. In IBS, however, these contractions are irregular and poorly coordinated. The fecal material is either retained too long, resulting in constipation, or expelled too soon, producing diarrhea. SYMPTOMS   The most common symptom of IBS is pain. It is typically in the lower left side of the belly (abdomen). But it may occur anywhere in the abdomen. It can be felt as heartburn, backache, or even as a dull pain in the arms or shoulders. The pain comes from excessive bowel-muscle spasms and from the buildup of gas and fecal material in the colon. This pain:  Can range from sharp belly (abdominal) cramps to a dull, continuous ache.   Usually worsens soon after eating.   Is typically relieved by having a bowel movement or passing  gas.  Abdominal pain is usually accompanied by constipation. But it may also produce diarrhea. The diarrhea typically occurs right after a meal or upon arising in the morning. The stools are typically soft and watery. They are often flecked with secretions (mucus). Other symptoms of IBS include:  Bloating.   Loss of appetite.   Heartburn.   Feeling sick to your stomach (nausea).   Belching   Vomiting   Gas.  IBS may also cause a number of symptoms that are unrelated to the digestive system:  Fatigue.   Headaches.   Anxiety   Shortness of breath   Difficulty in concentrating.   Dizziness.  These symptoms tend to come and go. DIAGNOSIS   The symptoms of IBS closely mimic the symptoms of other, more serious digestive disorders. So your caregiver may wish to perform a variety of additional tests to exclude these disorders. He/she wants to be certain of learning what is wrong (diagnosis). The nature and purpose of each test will be explained to you. TREATMENT A number of medications are available to help correct bowel function and/or relieve bowel spasms and abdominal pain. Among the drugs available are:  Mild, non-irritating laxatives for severe constipation and to help restore normal bowel habits.   Specific anti-diarrheal medications to treat severe or prolonged diarrhea.   Anti-spasmodic agents to relieve intestinal cramps.   Your caregiver may also decide to treat you with a mild tranquilizer or sedative during unusually stressful periods in your life.  The important thing to remember  is that if any drug is prescribed for you, make sure that you take it exactly as directed. Make sure that your caregiver knows how well it worked for you. HOME CARE INSTRUCTIONS    Avoid foods that are high in fat or oils. Some examples [id number]:[id number] cream, butter, frankfurters, sausage, and other fatty meats.   Avoid foods that have a laxative effect, such as fruit, fruit juice, and dairy  products.   Cut out carbonated drinks, chewing gum, and "gassy" foods, such as beans and cabbage. This may help relieve bloating and belching.   Bran taken with plenty of liquids may help relieve constipation.   Keep track of what foods seem to trigger your symptoms.   Avoid emotionally charged situations or circumstances that produce anxiety.   Start or continue exercising.   Get plenty of rest and sleep.  MAKE SURE YOU:    Understand these instructions.   Will watch your condition.   Will get help right away if you are not doing well or get worse.  Document Released: 11/11/2005 Document Revised: 10/31/2011 Document Reviewed: 07/01/2008 [facility] Patient Information 2012 [place], [place].

## 2012-04-29 NOTE — Progress Notes (Signed)
  Subjective:   Patient ID: Leah Dominguez, female DOB: Feb 10, 1981 31 y.o. MRN: [id number] HPI:  1. Left lower quadrant abdominal pain Course: unresolved, about the same Synopsis: patient has had 4 days of LLQ abdominal pain that is not new. Has had it in the past. crampy in nature, and associated with increased gas. Normal eating, normal stooling, normal voiding. No fever. Patient was seen in the ED and an US revealed no ovarian pathology, small cyst in uterus. She has not taken any medication. No known aggravating factors.   History  Substance Use Topics  . Smoking status: Never Smoker   . Smokeless tobacco: Never Used  . Alcohol Use: No    Review of Systems: Pertinent items are noted in HPI.  Labs Reviewed: yes, and [place] Reviewed Chart Review for last notes.     Objective:   Filed Vitals:   04/29/12 0847  BP: 120/72  Pulse: 78  Temp: 99.5 F (37.5 C)  TempSrc: Oral  Height: 5\' 11"  (1.803 m)  Weight: 203 lb (92.08 kg)   Physical Exam: General: aaf, nad, pleasant Abdomen: soft and non-tender without masses, organomegaly or hernias noted.  No guarding or rebound Assessment & Plan:

## 2012-04-29 NOTE — Assessment & Plan Note (Signed)
Intermittent left lower quadrant cramping pain with negative workup thus far associated with increased gas. Gave patient information about this diagnosis.  Meds ordered this encounter  Medications  . dicyclomine (BENTYL) 10 MG capsule    Sig: Take 1 capsule (10 mg total) by mouth 4 (four) times daily -  before meals and at bedtime.    Dispense:  90 capsule    Refill:  3

## 2012-10-21 ENCOUNTER — Inpatient Hospital Stay (HOSPITAL_COMMUNITY): Payer: Medicaid Other

## 2012-10-21 ENCOUNTER — Encounter (HOSPITAL_COMMUNITY): Payer: Self-pay

## 2012-10-21 ENCOUNTER — Inpatient Hospital Stay (HOSPITAL_COMMUNITY)
Admission: AD | Admit: 2012-10-21 | Discharge: 2012-10-21 | Disposition: A | Discharge status: Home / Self Care | Payer: Medicaid Other | Source: Ambulatory Visit | Attending: Obstetrics & Gynecology | Admitting: Obstetrics & Gynecology

## 2012-10-21 DIAGNOSIS — R1032 Left lower quadrant pain: Secondary | ICD-10-CM | POA: Insufficient documentation

## 2012-10-21 DIAGNOSIS — R109 Unspecified abdominal pain: Secondary | ICD-10-CM

## 2012-10-21 LAB — WET PREP, GENITAL: Trich, Wet Prep: NONE SEEN

## 2012-10-21 LAB — CBC
HCT: 36 % (ref 36.0–46.0)
MCV: 88 fL (ref 78.0–100.0)
RBC: 4.09 MIL/uL (ref 3.87–5.11)
WBC: 5.8 10*3/uL (ref 4.0–10.5)

## 2012-10-21 LAB — COMPREHENSIVE METABOLIC PANEL
BUN: 8 mg/dL (ref 6–23)
CO2: 26 mEq/L (ref 19–32)
Chloride: 103 mEq/L (ref 96–112)
Creatinine, Ser: 0.71 mg/dL (ref 0.50–1.10)
GFR calc Af Amer: >90 mL/min (ref >90)
GFR calc non Af Amer: >90 mL/min (ref >90)
Glucose, Bld: 90 mg/dL (ref 70–99)
Total Bilirubin: 0.9 mg/dL (ref 0.3–1.2)

## 2012-10-21 LAB — URINALYSIS, ROUTINE W REFLEX MICROSCOPIC
Bilirubin Urine: NEGATIVE
Ketones, ur: NEGATIVE mg/dL
Specific Gravity, Urine: >1.03 — ABNORMAL HIGH (ref 1.005–1.030)
Urobilinogen, UA: 1 mg/dL (ref 0.0–1.0)

## 2012-10-21 MED ORDER — KETOROLAC TROMETHAMINE 10 MG PO TABS: 10.0000 mg | ORAL_TABLET | Freq: Four times a day (QID) | ORAL | Status: DC | PRN

## 2012-10-21 MED ORDER — KETOROLAC TROMETHAMINE 60 MG/2ML IM SOLN
60.0000 mg | Freq: Once | INTRAMUSCULAR | Status: AC
  Administered 2012-10-21: 60 mg via INTRAMUSCULAR
  Filled 2012-10-21: qty 2

## 2012-10-21 NOTE — MAU Provider Note (Signed)
History     CSN: [id number]  Arrival date and time: 10/21/12 1243   First Provider Initiated Contact with Patient 10/21/12 1332      Chief Complaint  Patient presents with  . Abdominal Pain  . Vaginal Bleeding   HPI Leah Dominguez 31 y.o. Client comes to MAU with LLQ pain since yesterday.  Took a Vicodin yesterday and it did not help her pain.  Did not sleep last night due to the pain.  Has very irregular periods and has intermittent bleeding for a day at a time.  Has been followed in the office with various treatments being tried to regulate her menses.  OB History    Grav Para Term Preterm Abortions TAB SAB Ect Mult Living   7 2 2  0 4 3 1  0 0 2      Past Medical History  Diagnosis Date  . Hypertension     PIH with last pregnancy  . Bacterial vaginosis   . Anxiety and depression     Past Surgical History  Procedure Date  . No past surgeries   . Induced abortion 08/2011    Family History  Problem Relation Age of Onset  . Anesthesia problems Neg Hx   . Diabetes Maternal Grandmother   . Diabetes Maternal Uncle     History  Substance Use Topics  . Smoking status: Never Smoker   . Smokeless tobacco: Never Used  . Alcohol Use: No    Allergies: No Known Allergies  Prescriptions prior to admission  Medication Sig Dispense Refill  . desogestrel-ethinyl estradiol (VELIVET,CAZIANT,CESIA,CYCLESSA) 0.1/0.125/0.15 -0.025 MG tablet Take 1 tablet by mouth daily.      Marland Kitchen HYDROcodone-acetaminophen (NORCO/VICODIN) 5-325 MG per tablet Take 1-2 tablets by mouth every 6 (six) hours as needed. For pain      . ibuprofen (ADVIL,MOTRIN) 200 MG tablet Take 400 mg by mouth every 6 (six) hours as needed. For pain      . naproxen sodium (ANAPROX) 220 MG tablet Take 440 mg by mouth every 12 (twelve) hours as needed. For pain      . vitamin B-12 (CYANOCOBALAMIN) 1000 MCG tablet Take 1 tablet (1,000 mcg total) by mouth daily.  30 tablet  3    Review of Systems  Constitutional:  Negative for fever.  Gastrointestinal: Positive for abdominal pain. Negative for nausea, vomiting, diarrhea and constipation.  Genitourinary:       No vaginal discharge. Vaginal bleeding. No dysuria.   Physical Exam   Blood pressure 108/78, pulse 104, temperature 98 F (36.7 C), resp. rate 16, height 5\' 11"  (1.803 m), weight 89.812 kg (198 lb), last menstrual period 09/25/2012, SpO2 100.00%.  Physical Exam  Nursing note and vitals reviewed. Constitutional: She is oriented to person, place, and time. She appears well-developed and well-nourished. She appears distressed.  HENT:  Head: Normocephalic.  Eyes: EOM are normal.  Neck: Neck supple.  GI: Soft. Bowel sounds are normal. There is tenderness. There is guarding. There is no rebound.       Pain in LLQ.  Also has referred pain in LLQ when RLQ is palpated.  Genitourinary:       Speculum exam: Vagina - Small amount of bloody discharge, no odor Cervix - No active bleeding Bimanual exam: Cervix closed Client tearful with speculum exam.  Reports pain in beginning a bimanual exam.  Very gentle exam done and no abnormality felt although exam was very limited as client was tearful. GC/Chlam, wet prep done Chaperone present for  exam.  Musculoskeletal: Normal range of motion.  Neurological: She is alert and oriented to person, place, and time.  Skin: Skin is warm and dry.  Psychiatric: She has a normal mood and affect.    MAU Course  Procedures Results for orders placed during the hospital encounter of 10/21/12 (from the past 24 hour(s))  URINALYSIS, ROUTINE W REFLEX MICROSCOPIC     Status: Abnormal   Collection Time   10/21/12 12:51 PM      Component Value Range   Color, Urine YELLOW  YELLOW   APPearance HAZY (*) CLEAR   Specific Gravity, Urine >1.030 (*) 1.005 - 1.030   pH 6.0  5.0 - 8.0   Glucose, UA NEGATIVE  NEGATIVE mg/dL   Hgb urine dipstick MODERATE (*) NEGATIVE   Bilirubin Urine NEGATIVE  NEGATIVE   Ketones, ur  NEGATIVE  NEGATIVE mg/dL   Protein, ur NEGATIVE  NEGATIVE mg/dL   Urobilinogen, UA 1.0  0.0 - 1.0 mg/dL   Nitrite NEGATIVE  NEGATIVE   Leukocytes, UA NEGATIVE  NEGATIVE  URINE MICROSCOPIC-ADD ON     Status: Abnormal   Collection Time   10/21/12 12:51 PM      Component Value Range   Squamous Epithelial / LPF MANY (*) RARE   WBC, UA 0-2  <3 WBC/hpf   RBC / HPF 3-6  <3 RBC/hpf   Bacteria, UA FEW (*) RARE   Urine-Other MUCOUS PRESENT    POCT PREGNANCY, URINE     Status: Normal   Collection Time   10/21/12  1:01 PM      Component Value Range   Preg Test, Ur NEGATIVE  NEGATIVE  WET PREP, GENITAL     Status: Abnormal   Collection Time   10/21/12  1:31 PM      Component Value Range   Yeast Wet Prep HPF POC NONE SEEN  NONE SEEN   Trich, Wet Prep NONE SEEN  NONE SEEN   Clue Cells Wet Prep HPF POC FEW (*) NONE SEEN   WBC, Wet Prep HPF POC MODERATE (*) NONE SEEN  CBC     Status: Normal   Collection Time   10/21/12  1:45 PM      Component Value Range   WBC 5.8  4.0 - 10.5 K/uL   RBC 4.09  3.87 - 5.11 MIL/uL   Hemoglobin 12.1  12.0 - 15.0 g/dL   HCT 40.9  81.1 - 91.4 %   MCV 88.0  78.0 - 100.0 fL   MCH 29.6  26.0 - 34.0 pg   MCHC 33.6  30.0 - 36.0 g/dL   RDW 78.2  95.6 - 21.3 %   Platelets 198  150 - 400 K/uL  COMPREHENSIVE METABOLIC PANEL     Status: Abnormal   Collection Time   10/21/12  1:45 PM      Component Value Range   Sodium 136  135 - 145 mEq/L   Potassium 3.4 (*) 3.5 - 5.1 mEq/L   Chloride 103  96 - 112 mEq/L   CO2 26  19 - 32 mEq/L   Glucose, Bld 90  70 - 99 mg/dL   BUN 8  6 - 23 mg/dL   Creatinine, Ser 0.86  0.50 - 1.10 mg/dL   Calcium 9.0  8.4 - 57.8 mg/dL   Total Protein 6.7  6.0 - 8.3 g/dL   Albumin 3.3 (*) 3.5 - 5.2 g/dL   AST 11  0 - 37 U/L   ALT 8  0 -  35 U/L   Alkaline Phosphatase 63  39 - 117 U/L   Total Bilirubin 0.9  0.3 - 1.2 mg/dL   GFR calc non Af Amer >90  >90 mL/min   GFR calc Af Amer >90  >90 mL/min    MDM Pelvic ultrasound  History: Lower  abdominal pain and irregular menses  Technique: Study was performed transabdominally to optimize pelvic  field of view evaluation and transvaginally to optimize internal  visceral architectural evaluation.  Findings: The uterus is anteverted. Uterus measures 9.3 x 6.5 x  5.0 cm in size. There is no intrauterine mass. The endometrium  measures 5 mm in thickness with a smooth contour.  Right ovary measures 3.9 x 4.2 x 2.8 cm in size. Left ovary  measures 3.4 x 2.3 x 1.9 see meter in size. There are physiologic  follicles in both ovaries. Beyond these follicles, there is no  extrauterine pelvic or adnexal mass. There is no free pelvic  fluid.  Conclusion: No abnormality noted.   1520  Pain completely relieved with Toradol 60 mg IM Cervical cultures pending.  Assessment and Plan  Abdominal pain  Plan Rx Toradol tablets to use if pain returns Your ultrasound is normal.  This pain may not be caused by the GYN organs.  Seek additional care from medical provider to look at additional causes of abdominal pain if pain returns. Your urine culture is pending.  The office will call you if you need any additional treatment. Drink at least 8 8-oz glasses of water every day.   Emillio Ngo 10/21/2012, 1:38 PM

## 2012-10-21 NOTE — MAU Note (Signed)
Patient states she has been having lower abdominal pain for a while but is getting worse. Usually comes and goes but is now constant Has had irregular periods for a while. Was taking birth control bills until 3 months ago. Started bleeding last night but unsure if this is the period.

## 2012-10-21 NOTE — MAU Note (Signed)
Patient is in with c/o sharp constant llq abdominal pain that started yesterday at 1500pm. She states that the pain is too intense. lmp 09/25/2012. She states that she is having brown non-odorous vaginal discharge since yesterday. Patient states that she had no relief from one tablet hydrocodone last night.

## 2012-10-23 LAB — GC/CHLAMYDIA PROBE AMP
CT Probe RNA: NEGATIVE
GC Probe RNA: NEGATIVE

## 2013-05-03 ENCOUNTER — Ambulatory Visit (INDEPENDENT_AMBULATORY_CARE_PROVIDER_SITE_OTHER): Payer: Medicaid Other | Admitting: Obstetrics

## 2013-05-03 ENCOUNTER — Encounter: Payer: Self-pay | Admitting: Obstetrics

## 2013-05-03 VITALS — BP 121/83 | HR 83 | Temp 98.5°F

## 2013-05-03 DIAGNOSIS — N946 Dysmenorrhea, unspecified: Secondary | ICD-10-CM

## 2013-05-03 DIAGNOSIS — N926 Irregular menstruation, unspecified: Secondary | ICD-10-CM

## 2013-05-03 MED ORDER — NAPROXEN 500 MG PO TBEC: 500.0000 mg | DELAYED_RELEASE_TABLET | Freq: Two times a day (BID) | ORAL | Status: DC

## 2013-05-03 NOTE — Progress Notes (Signed)
Subjective:     Leah Dominguez is a 32 y.o. female here for a routine exam.  Current complaints: Irregular cycles. Patient reports she had Elective abortion 10/2011 and has not had a normal cycle since. Patient has tried the birth control pill and provera and states she has not returned to a regular cycle.   Gynecologic History Patient's last menstrual period was 04/19/2013. Contraception: none Last Pap: 07/2012. Results were: normal   Obstetric History OB History   Grav Para Term Preterm Abortions TAB SAB Ect Mult Living   7 2 2  0 4 3 1  0 0 2     # Outc Date GA Lbr Len/2nd Wgt Sex Del Anes PTL Lv   1 TAB            2 TAB            3 SAB            4 TRM            5 TRM            6 TAB            7 GRA            Comments: System Generated. Please review and update pregnancy details.       The following portions of the patient's history were reviewed and updated as appropriate: allergies, current medications, past family history, past medical history, past social history, past surgical history and problem list.  Review of Systems Pertinent items are noted in HPI.    Objective:    General appearance: alert and no distress Abdomen: normal findings: soft, non-tender Pelvic: cervix normal in appearance, external genitalia normal, no adnexal masses or tenderness, no cervical motion tenderness, uterus normal size, shape, and consistency and vagina normal without discharge    Assessment:    Healthy female exam.    Plan:    Education reviewed: safe sex/STD prevention and irregular cycles. Contraception: OCP (estrogen/progesterone). Follow up in: several months. Continue OCP's.   Ultrasound ordered.

## 2013-05-05 ENCOUNTER — Ambulatory Visit (HOSPITAL_COMMUNITY)
Admission: RE | Admit: 2013-05-05 | Discharge: 2013-05-05 | Disposition: A | Discharge status: Home / Self Care | Payer: BC Managed Care – PPO | Source: Ambulatory Visit | Attending: Obstetrics | Admitting: Obstetrics

## 2013-05-05 DIAGNOSIS — N926 Irregular menstruation, unspecified: Secondary | ICD-10-CM | POA: Insufficient documentation

## 2013-05-17 ENCOUNTER — Emergency Department (HOSPITAL_BASED_OUTPATIENT_CLINIC_OR_DEPARTMENT_OTHER): Payer: BC Managed Care – PPO

## 2013-05-17 ENCOUNTER — Emergency Department (HOSPITAL_BASED_OUTPATIENT_CLINIC_OR_DEPARTMENT_OTHER)
Admission: EM | Admit: 2013-05-17 | Discharge: 2013-05-17 | Disposition: A | Discharge status: Home / Self Care | Payer: BC Managed Care – PPO | Attending: Emergency Medicine | Admitting: Emergency Medicine

## 2013-05-17 ENCOUNTER — Encounter (HOSPITAL_BASED_OUTPATIENT_CLINIC_OR_DEPARTMENT_OTHER): Payer: Self-pay

## 2013-05-17 DIAGNOSIS — Z3202 Encounter for pregnancy test, result negative: Secondary | ICD-10-CM | POA: Insufficient documentation

## 2013-05-17 DIAGNOSIS — R109 Unspecified abdominal pain: Secondary | ICD-10-CM | POA: Insufficient documentation

## 2013-05-17 DIAGNOSIS — Z8659 Personal history of other mental and behavioral disorders: Secondary | ICD-10-CM | POA: Insufficient documentation

## 2013-05-17 DIAGNOSIS — Z8742 Personal history of other diseases of the female genital tract: Secondary | ICD-10-CM | POA: Insufficient documentation

## 2013-05-17 DIAGNOSIS — I10 Essential (primary) hypertension: Secondary | ICD-10-CM | POA: Insufficient documentation

## 2013-05-17 LAB — URINALYSIS, ROUTINE W REFLEX MICROSCOPIC
Glucose, UA: NEGATIVE mg/dL
Ketones, ur: 40 mg/dL — AB
Leukocytes, UA: NEGATIVE
Protein, ur: NEGATIVE mg/dL

## 2013-05-17 LAB — URINE MICROSCOPIC-ADD ON

## 2013-05-17 MED ORDER — KETOROLAC TROMETHAMINE 30 MG/ML IJ SOLN
60.0000 mg | Freq: Once | INTRAMUSCULAR | Status: AC
  Administered 2013-05-17: 60 mg via INTRAMUSCULAR
  Filled 2013-05-17: qty 1

## 2013-05-17 NOTE — ED Notes (Signed)
Back pain that started 2 days ago.

## 2013-05-17 NOTE — ED Provider Notes (Signed)
History    CSN: [id number] Arrival date & time 05/17/13  1633  First MD Initiated Contact with Patient 05/17/13 1659     Chief Complaint  Patient presents with  . Back Pain   (Consider location/radiation/quality/duration/timing/severity/associated sxs/prior Treatment) HPI Comments: Pt states that she has been having ongoing abdominal pain for the last couple of weeks:[facility] but now she is having pain in her back and lower abdomen:denies n/v/d, fever  Patient is a 32 y.o. female presenting with back pain. The history is provided by the patient. No language interpreter was used.  Back Pain Location:  Lumbar spine Quality:  Stabbing Pain severity:  Moderate Pain is:  Same all the time Onset quality:  Sudden Timing:  Intermittent Progression:  Worsening Relieved by:  Nothing Ineffective treatments:  Narcotics Associated symptoms: abdominal pain   Associated symptoms: no fever    Past Medical History  Diagnosis Date  . Hypertension     PIH with last pregnancy  . Bacterial vaginosis   . Anxiety and depression    Past Surgical History  Procedure Laterality Date  . No past surgeries    . Induced abortion  08/2011   Family History  Problem Relation Age of Onset  . Anesthesia problems Neg Hx   . Diabetes Maternal Grandmother   . Diabetes Maternal Uncle    History  Substance Use Topics  . Smoking status: Never Smoker   . Smokeless tobacco: Never Used  . Alcohol Use: No   OB History   Grav Para Term Preterm Abortions TAB SAB Ect Mult Living   7 2 2  0 4 3 1  0 0 2     Review of Systems  Constitutional: Negative for fever.  Respiratory: Negative.   Cardiovascular: Negative.   Gastrointestinal: Positive for abdominal pain.  Musculoskeletal: Positive for back pain.    Allergies  Review of patient's allergies indicates no known allergies.  Home Medications   Current Outpatient Rx  Name  Route  Sig  Dispense   Refill  . desogestrel-ethinyl estradiol (VELIVET,CAZIANT,CESIA,CYCLESSA) 0.1/0.125/0.15 -0.025 MG tablet   Oral   Take 1 tablet by mouth daily.         Marland Kitchen HYDROcodone-acetaminophen (NORCO) 10-325 MG per tablet   Oral   Take 1 tablet by mouth every 6 (six) hours as needed for pain.         Marland Kitchen ketorolac (TORADOL) 10 MG tablet   Oral   Take 1 tablet (10 mg total) by mouth every 6 (six) hours as needed for pain (Do not take Ibuprofen with this medication.).   20 tablet   0   . naproxen (EC-NAPROSYN) 500 MG EC tablet   Oral   Take 1 tablet (500 mg total) by mouth 2 (two) times daily with a meal.   40 tablet   5   . EXPIRED: vitamin B-12 (CYANOCOBALAMIN) 1000 MCG tablet   Oral   Take 1 tablet (1,000 mcg total) by mouth daily.   30 tablet   3    BP 125/78  Pulse 84  Temp(Src) 98.1 F (36.7 C) (Oral)  Resp 16  Ht 5\' 11"  (1.803 m)  Wt 190 lb (86.183 kg)  BMI 26.51 kg/m2  SpO2 100%  LMP 04/19/2013 Physical Exam  Nursing note and vitals reviewed. Constitutional: She is oriented to person, place, and time. She appears well-developed and well-nourished.  HENT:  Head: Normocephalic and atraumatic.  Eyes: Conjunctivae and EOM  are normal.  Neck: Normal range of motion. Neck supple.  Cardiovascular: Normal rate and regular rhythm.   Pulmonary/Chest: Effort normal and breath sounds normal.  Abdominal: Soft. Bowel sounds are normal. There is no tenderness.  Musculoskeletal: Normal range of motion.  Neurological: She is alert and oriented to person, place, and time.  Skin: Skin is warm and dry.  Psychiatric: She has a normal mood and affect.    ED Course  Procedures (including critical care time) Labs Reviewed  URINALYSIS, ROUTINE W REFLEX MICROSCOPIC - Abnormal; Notable for the following:    Color, Urine AMBER (*)    APPearance CLOUDY (*)    Specific Gravity, Urine 1.033 (*)    Hgb urine dipstick MODERATE (*)    Bilirubin Urine SMALL (*)    Ketones, ur 40 (*)    All  other components within normal limits  URINE MICROSCOPIC-ADD ON - Abnormal; Notable for the following:    Squamous Epithelial / LPF FEW (*)    All other components within normal limits  PREGNANCY, URINE   No results found. 1. Abdominal pain   2. Flank pain     MDM  Abdomen is non surgical:pt is to follow up with ob:pt states that she has pain medication at home  Teressa Lower, NP 05/17/13 1806

## 2013-05-17 NOTE — ED Provider Notes (Signed)
Medical screening examination/treatment/procedure(s) were performed by non-physician practitioner and as supervising physician I was immediately available for consultation/collaboration.   Adolf Ormiston, MD 05/17/13 1951 

## 2013-05-20 ENCOUNTER — Ambulatory Visit: Payer: Medicaid Other | Admitting: Obstetrics

## 2013-06-03 ENCOUNTER — Ambulatory Visit: Payer: Medicaid Other | Admitting: Obstetrics

## 2013-09-20 ENCOUNTER — Ambulatory Visit (INDEPENDENT_AMBULATORY_CARE_PROVIDER_SITE_OTHER): Payer: Medicaid Other | Admitting: Family Medicine

## 2013-09-20 VITALS — BP 116/78 | HR 90 | Ht 71.0 in | Wt 189.0 lb

## 2013-09-20 DIAGNOSIS — IMO0002 Reserved for concepts with insufficient information to code with codable children: Secondary | ICD-10-CM

## 2013-09-20 DIAGNOSIS — N979 Female infertility, unspecified: Secondary | ICD-10-CM

## 2013-09-20 DIAGNOSIS — N926 Irregular menstruation, unspecified: Secondary | ICD-10-CM

## 2013-09-20 DIAGNOSIS — N946 Dysmenorrhea, unspecified: Secondary | ICD-10-CM

## 2013-09-20 MED ORDER — PRENATAL VITAMINS 28-0.8 MG PO TABS: 1.0000 | ORAL_TABLET | ORAL | Status: DC

## 2013-09-20 NOTE — Progress Notes (Signed)
  Subjective:    Patient ID: Leah Dominguez, female    DOB: 10-May-1981, 32 y.o.   MRN: [id number]  HPI Patient is a 32 year old G3 P 2012 who presents today for evaluation of irregular periods and inability to conceive a child. Patient has previously had 2 healthy full-term vaginal deliveries, the last being approximately 5 years ago, she did have elective abortion approximately 2 years ago where she underwent dilatation and evacuation. Since that time she has had irregular periods, she states that she has a small period each month the lasts approximately one day, she does report intermittent spotting, she was seen and evaluated by her OB/GYN in June of 2014 and underwent transvaginal ultrasound that was unremarkable at that time, she's had previous lab work as outlined below, she states that she would like to become pregnant, she's been having intercourse with her partner at least twice weekly for the past year, she has been off all forms of birth control for at least 12 months   Review of Systems  Constitutional: Negative for fever, chills and fatigue.  Respiratory: Negative for cough, choking and shortness of breath.   Cardiovascular: Negative for chest pain.  Gastrointestinal: Negative for nausea, vomiting and diarrhea.       Objective:   Physical Exam Vitals: Reviewed General: Pleasant female, no acute distress HEENT: Pupils are equal round and reactive to light, extraocular movements are intact, peripheral vision was intact, moist mucous membranes, neck was supple next a cardiac: Regular rate and rhythm, S1 and S2 present, no murmurs Respiratory: Clear to auscultation bilaterally, normal effort Abdomen: Soft, nontender Pelvic exam deferred as recently performed in June of 2014 at her OB/GYN and was unremarkable at that time  TSH performed approximately one year ago was within normal limits  Transvaginal/transabdominal ultrasound performed in June of 2014 was within normal limits,  normal ovaries without cysts or mass, uterine lining measured approximate 7 mm however was not fully visualized      Assessment & Plan:  Please see problem specific assessment and plan.

## 2013-09-20 NOTE — Assessment & Plan Note (Signed)
Patient presents for evaluation of dysmenorrhea. Urine pregnancy test in office was negative. No clinical signs of PCOS. Reviewed ultrasound from June of 2014 which was unremarkable. -Will recheck thyroid and prolactin levels

## 2013-09-20 NOTE — Assessment & Plan Note (Addendum)
Patient has been unable to conceive after 12 months of regular intercourse. It is uncertain if this is due to anovulation or complications status post D&E performed 2 years ago. -Patient will be referred to fertility clinic, patient may require hysteroscopically for further evaluation of uterine lining given previous D and E two years ago, will also check TSH and prolactin levels.

## 2013-09-20 NOTE — Patient Instructions (Signed)
Infertility/Abnormal periods - check TSH and Prolactin, a referral has been made to the fertility clinic.

## 2013-09-21 ENCOUNTER — Encounter: Payer: Self-pay | Admitting: Family Medicine

## 2013-10-11 ENCOUNTER — Telehealth: Payer: Self-pay

## 2013-10-11 NOTE — Telephone Encounter (Signed)
Patient calling inquiring about referral to fertility clinic. States she has been waiting for over 3 weeks. Please call patient.

## 2013-10-12 NOTE — Telephone Encounter (Signed)
Referral faxed to [facility]. They will contact pt  [address] [place], [place] [address]   Phone:[phone]

## 2013-10-12 NOTE — Telephone Encounter (Signed)
Called pt. Unable to leave message (unavailable right now). Waiting for call back. PLEASE tell pt, that the referral was faxed. See previous message. Also, pt does not need referral from [place] due to her insurance Herbalist) Thanks. Lorenda Hatchet, Renato Battles

## 2013-10-15 NOTE — Telephone Encounter (Signed)
Pt never called back. .Leah Dominguez  

## 2014-01-27 ENCOUNTER — Encounter: Payer: Self-pay | Admitting: Family Medicine

## 2014-01-27 ENCOUNTER — Ambulatory Visit (INDEPENDENT_AMBULATORY_CARE_PROVIDER_SITE_OTHER): Payer: Medicaid Other | Admitting: Family Medicine

## 2014-01-27 VITALS — BP 111/80 | HR 73 | Temp 98.2°F | Ht 71.0 in | Wt 196.0 lb

## 2014-01-27 DIAGNOSIS — R51 Headache: Secondary | ICD-10-CM

## 2014-01-27 MED ORDER — TRAMADOL HCL 50 MG PO TABS: 50.0000 mg | ORAL_TABLET | Freq: Three times a day (TID) | ORAL | Status: DC | PRN

## 2014-01-27 MED ORDER — CYCLOBENZAPRINE HCL 5 MG PO TABS: 5.0000 mg | ORAL_TABLET | Freq: Three times a day (TID) | ORAL | Status: DC | PRN

## 2014-01-27 NOTE — Assessment & Plan Note (Signed)
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

## 2014-01-27 NOTE — Progress Notes (Signed)
Patient ID: Leah Dominguez, female   DOB: 05/01/1981, 33 y.o.   MRN: [id number]  Kevin FentonSamuel Bradshaw, MD Phone: [phone]  Subjective:  Chief complaint-noted  # SDA for headache  Patient comes in complaining of a right-sided headache that persisted now for 2-1/2-3 days. She states that the headache came on gradually it was initially in a bandlike distribution around her head for about the first day and a half. She tried naproxen in the first day which did not help it. She tried ibuprofen today which also did not help. She now has some photophobia and loss of appetite. She denies fever, sudden onset, worsening with position, loss of vision, numbness or tingling, or vomiting.   ROS-  Past Medical History Patient Active Problem List   Diagnosis Date Noted  . Infertility 09/20/2013  . Dysmenorrhea 05/03/2013  . Irritable bowel syndrome 04/29/2012  . B12 deficiency 03/27/2012  . Abdominal pain, chronic, left lower quadrant 02/18/2012  . Well woman exam 12/12/2011  . Pelvic pain in female 12/12/2011  . Dysthymic disorder 10/31/2011  . Headache 10/31/2011  . VAGINITIS NOS 05/05/2007    Medications- reviewed and updated Current Outpatient Prescriptions  Medication Sig Dispense Refill  . cyclobenzaprine (FLEXERIL) 5 MG tablet Take 1 tablet (5 mg total) by mouth 3 (three) times daily as needed for muscle spasms.  20 tablet  0  . naproxen (EC-NAPROSYN) 500 MG EC tablet Take 1 tablet (500 mg total) by mouth 2 (two) times daily with a meal.  40 tablet  5  . Prenatal Vit-Fe Fumarate-FA (PRENATAL VITAMINS) 28-0.8 MG TABS Take 1 tablet by mouth 1 day or 1 dose.  90 tablet  1  . traMADol (ULTRAM) 50 MG tablet Take 1 tablet (50 mg total) by mouth every 8 (eight) hours as needed.  5 tablet  0   No current facility-administered medications for this visit.    Objective: BP 111/80  Pulse 73  Temp(Src) 98.2 F (36.8 C) (Oral)  Ht 5\' 11"  (1.803 m)  Wt 196 lb (88.905 kg)  BMI 27.35 kg/m2  SpO2  100% Gen: NAD, alert, cooperative with exam HEENT: NCAT, EOMI, PERRL Neuro: Alert and oriented, strength 5/5 and sensation intact in all 4 extremities, cranial nerve II through XII intact MSK: Tenderness palpation of temporalis muscle on R, insertion of SCM on R, and down the R side of her neck.    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 traMADol (ULTRAM) 50 MG tablet    Sig: Take 1 tablet (50 mg total) by mouth every 8 (eight) hours as needed.    Dispense:  5 tablet    Refill:  0  . cyclobenzaprine (FLEXERIL) 5 MG tablet    Sig: Take 1 tablet (5 mg total) by mouth 3 (three) times daily as needed for muscle spasms.    Dispense:  20 tablet    Refill:  0

## 2014-01-27 NOTE — Patient Instructions (Signed)
It was great to meet you today  I Think you have a mixed headache, tension type and migraine.   Try the muscle relaxer around 30 -1 hr before you want to go to bed, Try the tramadol as well  Follow up with Dr.Adamo if these become regular.

## 2014-04-19 ENCOUNTER — Ambulatory Visit: Payer: Medicaid Other | Admitting: Obstetrics

## 2014-05-09 ENCOUNTER — Encounter: Payer: Medicaid Other | Admitting: Family Medicine

## 2014-09-06 ENCOUNTER — Encounter (HOSPITAL_BASED_OUTPATIENT_CLINIC_OR_DEPARTMENT_OTHER): Payer: Self-pay | Admitting: Emergency Medicine

## 2014-09-06 ENCOUNTER — Emergency Department (HOSPITAL_BASED_OUTPATIENT_CLINIC_OR_DEPARTMENT_OTHER)
Admission: EM | Admit: 2014-09-06 | Discharge: 2014-09-06 | Disposition: A | Discharge status: Home / Self Care | Payer: Medicaid Other | Attending: Emergency Medicine | Admitting: Emergency Medicine

## 2014-09-06 ENCOUNTER — Emergency Department (HOSPITAL_BASED_OUTPATIENT_CLINIC_OR_DEPARTMENT_OTHER): Payer: Medicaid Other

## 2014-09-06 DIAGNOSIS — Z8742 Personal history of other diseases of the female genital tract: Secondary | ICD-10-CM | POA: Insufficient documentation

## 2014-09-06 DIAGNOSIS — Z791 Long term (current) use of non-steroidal anti-inflammatories (NSAID): Secondary | ICD-10-CM | POA: Diagnosis not present

## 2014-09-06 DIAGNOSIS — Y92410 Unspecified street and highway as the place of occurrence of the external cause: Secondary | ICD-10-CM | POA: Diagnosis not present

## 2014-09-06 DIAGNOSIS — S0990XA Unspecified injury of head, initial encounter: Secondary | ICD-10-CM | POA: Insufficient documentation

## 2014-09-06 DIAGNOSIS — I10 Essential (primary) hypertension: Secondary | ICD-10-CM | POA: Diagnosis not present

## 2014-09-06 DIAGNOSIS — Y9389 Activity, other specified: Secondary | ICD-10-CM | POA: Diagnosis not present

## 2014-09-06 DIAGNOSIS — S3992XA Unspecified injury of lower back, initial encounter: Secondary | ICD-10-CM | POA: Diagnosis present

## 2014-09-06 DIAGNOSIS — F329 Major depressive disorder, single episode, unspecified: Secondary | ICD-10-CM | POA: Insufficient documentation

## 2014-09-06 DIAGNOSIS — F419 Anxiety disorder, unspecified: Secondary | ICD-10-CM | POA: Diagnosis not present

## 2014-09-06 DIAGNOSIS — S39012A Strain of muscle, fascia and tendon of lower back, initial encounter: Secondary | ICD-10-CM

## 2014-09-06 MED ORDER — NAPROXEN 500 MG PO TABS: 500.0000 mg | ORAL_TABLET | Freq: Two times a day (BID) | ORAL | Status: DC
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
## 2014-09-06 NOTE — Discharge Instructions (Signed)
Followup with your doctor if symptoms of the back or not improving over the period of about a week or 2. X-rays today were negative. Take the Naprosyn as directed.

## 2014-09-26 ENCOUNTER — Encounter (HOSPITAL_BASED_OUTPATIENT_CLINIC_OR_DEPARTMENT_OTHER): Payer: Self-pay | Admitting: Emergency Medicine

## 2014-11-07 ENCOUNTER — Ambulatory Visit: Payer: Medicaid Other | Admitting: Obstetrics & Gynecology

## 2015-03-06 ENCOUNTER — Encounter: Payer: Self-pay | Admitting: Internal Medicine

## 2015-03-06 ENCOUNTER — Ambulatory Visit (INDEPENDENT_AMBULATORY_CARE_PROVIDER_SITE_OTHER): Payer: Managed Care, Other (non HMO) | Admitting: Obstetrics & Gynecology

## 2015-03-06 ENCOUNTER — Encounter: Payer: Self-pay | Admitting: Obstetrics & Gynecology

## 2015-03-06 VITALS — BP 113/77 | HR 93 | Ht 71.0 in

## 2015-03-06 DIAGNOSIS — Z1151 Encounter for screening for human papillomavirus (HPV): Secondary | ICD-10-CM | POA: Diagnosis not present

## 2015-03-06 DIAGNOSIS — Z01419 Encounter for gynecological examination (general) (routine) without abnormal findings: Secondary | ICD-10-CM | POA: Diagnosis not present

## 2015-03-06 DIAGNOSIS — Z124 Encounter for screening for malignant neoplasm of cervix: Secondary | ICD-10-CM | POA: Diagnosis not present

## 2015-03-06 DIAGNOSIS — R109 Unspecified abdominal pain: Secondary | ICD-10-CM | POA: Diagnosis not present

## 2015-03-06 DIAGNOSIS — K589 Irritable bowel syndrome without diarrhea: Secondary | ICD-10-CM

## 2015-03-06 NOTE — Progress Notes (Signed)
Patient has headaches and continues to have abdominal pain that has been ongoing for over a year.

## 2015-03-06 NOTE — Progress Notes (Signed)
    GYNECOLOGY CLINIC ANNUAL PREVENTATIVE CARE ENCOUNTER NOTE  Subjective:     Leah Dominguez is a [age] y.o. [phone] female here for a routine annual gynecologic exam.  Current complaints:  No GYN complaints.  Not sexually active, no abnormal discharge, normal and regular menstrual periods.   Gynecologic History Patient's last menstrual period was 02/27/2015 (exact date). Contraception: abstinence Last Pap: 12/12/11. Results were: normal  Obstetric History OB History  Gravida Para Term Preterm AB SAB TAB Ectopic Multiple Living  7 2 2  0 4 1 3  0 0 2    # Outcome Date GA Lbr Len/2nd Weight Sex Delivery Anes PTL Lv  7 Gravida              Comments: System Generated. Please review and update pregnancy details.  6 TAB           5 Term           4 Term           3 SAB           2 TAB           1 TAB              Past Medical History  Diagnosis Date  . Hypertension     PIH with last pregnancy  . Anxiety and depression   . IBS (irritable bowel syndrome)    Past Surgical History  Procedure Laterality Date  . Induced abortion  08/2011    History   Social History  . Marital Status: Single    Spouse Name: N/A  . Number of Children: 2  . Years of Education: N/A   Occupational History  . [employer]    Social History Main Topics  . Smoking status: Never Smoker   . Smokeless tobacco: Never Used  . Alcohol Use: No  . Drug Use: No  . Sexual Activity:    Partners: Male    Birth Control/ Protection: None   Other Topics Concern  . Not on file   Social History Narrative    The following portions of the patient's history were reviewed and updated as appropriate: allergies, current medications, past family history, past medical history, past social history, past surgical history and problem list.  Review of Systems Constitutional: negative Gastrointestinal: positive for abdominal pain, constipation and diarrhea Neurological: positive for headaches.    Objective:   BP  113/77 mmHg  Pulse 93  Ht 5\' 11"  (1.803 m)  LMP 02/27/2015 (Exact Date) GENERAL: Well-developed, well-nourished female in no acute distress.  HEENT: Normocephalic, atraumatic. Sclerae anicteric.  NECK: Supple. Normal thyroid.  LUNGS: Clear to auscultation bilaterally.  HEART: Regular rate and rhythm. BREASTS: Symmetric in size. No masses, skin changes, nipple drainage, or lymphadenopathy. ABDOMEN: Soft, nontender, nondistended. No organomegaly. PELVIC: Normal external female genitalia. Vagina is pink and rugated.  Normal discharge. Normal cervix contour. Pap smear obtained. Uterus is normal in size. No adnexal mass or tenderness.  EXTREMITIES: No cyanosis, clubbing, or edema, 2+ distal pulses.   Assessment:   Annual gynecologic examination Gastrointestinal symptoms   Plan:   Pap done, will follow up results and manage accordingly. Referred to [facility] for GI issues Routine preventative health maintenance measures emphasized   Jaynie CollinsUGONNA  Mischa Pollard, MD, FACOG Attending Obstetrician & Gynecologist [facility]

## 2015-03-06 NOTE — Patient Instructions (Addendum)
Thank you for enrolling in [facility]. Please follow the instructions below to securely access your online medical record. MyChart allows you to send messages to your doctor, view your test results, manage appointments, and more.   How Do I Sign Up? 1. In your Internet browser, go to [employer] and enter [email or web address]. 2. Click on the Sign Up Now link in the Sign In box. You will see the New Member Sign Up page. 3. Enter your MyChart Access Code exactly as it appears below. You will not need to use this code after you've completed the sign-up process. If you do not sign up before the expiration date, you must request a new code.  MyChart Access Code: [email or web address] Expires: 05/05/2015  3:39 PM  4. Enter your Social Security Number ([id number]) and Date of Birth (mm/dd/yyyy) as indicated and click Submit. You will be taken to the next sign-up page. 5. Create a MyChart ID. This will be your MyChart login ID and cannot be changed, so think of one that is secure and easy to remember. 6. Create a MyChart password. You can change your password at any time. 7. Enter your Password Reset Question and Answer. This can be used at a later time if you forget your password.  8. Enter your e-mail address. You will receive e-mail notification when new information is available in [facility]. 9. Click Sign Up. You can now view your medical record.   Additional Information Remember, MyChart is NOT to be used for urgent needs. For medical emergencies, dial 911.     Preventive Care for Adults A healthy lifestyle and preventive care can promote health and wellness. Preventive health guidelines for women include the following key practices.  A routine yearly physical is a good way to check with your health care provider about your health and preventive screening. It is a chance to share any concerns and updates on your health and to receive a thorough exam.  Visit your dentist for a routine  exam and preventive care every 6 months. Brush your teeth twice a day and floss once a day. Good oral hygiene prevents tooth decay and gum disease.  The frequency of eye exams is based on your age, health, family medical history, use of contact lenses, and other factors. Follow your health care provider's recommendations for frequency of eye exams.  Eat a healthy diet. Foods like vegetables, fruits, whole grains, low-fat dairy products, and lean protein foods contain the nutrients you need without too many calories. Decrease your intake of foods high in solid fats, added sugars, and salt. Eat the right amount of calories for you.Get information about a proper diet from your health care provider, if necessary.  Regular physical exercise is one of the most important things you can do for your health. Most adults should get at least 150 minutes of moderate-intensity exercise (any activity that increases your heart rate and causes you to sweat) each week. In addition, most adults need muscle-strengthening exercises on 2 or more days a week.  Maintain a healthy weight. The body mass index (BMI) is a screening tool to identify possible weight problems. It provides an estimate of body fat based on height and weight. Your health care provider can find your BMI and can help you achieve or maintain a healthy weight.For adults 20 years and older:  A BMI below 18.5 is considered underweight.  A BMI of 18.5 to 24.9 is normal.  A BMI of 25 to 29.9 is considered overweight.  A BMI of 30 and above is considered obese.  Maintain normal blood lipids and cholesterol levels by exercising and minimizing your intake of saturated fat. Eat a balanced diet with plenty of fruit and vegetables. Blood tests for lipids and cholesterol should begin at age 40 and be repeated every 5 years. If your lipid or cholesterol levels are high, you are over 50, or you are at high risk for heart disease, you may need your cholesterol  levels checked more frequently.Ongoing high lipid and cholesterol levels should be treated with medicines if diet and exercise are not working.  If you smoke, find out from your health care provider how to quit. If you do not use tobacco, do not start.  Lung cancer screening is recommended for adults aged 85-80 years who are at high risk for developing lung cancer because of a history of smoking. A yearly low-dose CT scan of the lungs is recommended for people who have at least a 30-pack-year history of smoking and are a current smoker or have quit within the past 15 years. A pack year of smoking is smoking an average of 1 pack of cigarettes a day for 1 year (for example: 1 pack a day for 30 years or 2 packs a day for 15 years). Yearly screening should continue until the smoker has stopped smoking for at least 15 years. Yearly screening should be stopped for people who develop a health problem that would prevent them from having lung cancer treatment.  If you are pregnant, do not drink alcohol. If you are breastfeeding, be very cautious about drinking alcohol. If you are not pregnant and choose to drink alcohol, do not have more than 1 drink per day. One drink is considered to be 12 ounces (355 mL) of beer, 5 ounces (148 mL) of wine, or 1.5 ounces (44 mL) of liquor.  Avoid use of street drugs. Do not share needles with anyone. Ask for help if you need support or instructions about stopping the use of drugs.  High blood pressure causes heart disease and increases the risk of stroke. Your blood pressure should be checked at least every 1 to 2 years. Ongoing high blood pressure should be treated with medicines if weight loss and exercise do not work.  If you are 59-15 years old, ask your health care provider if you should take aspirin to prevent strokes.  Diabetes screening involves taking a blood sample to check your fasting blood sugar level. This should be done once every 3 years, after age 107, if you  are within normal weight and without risk factors for diabetes. Testing should be considered at a younger age or be carried out more frequently if you are overweight and have at least 1 risk factor for diabetes.  Breast cancer screening is essential preventive care for women. You should practice "breast self-awareness." This means understanding the normal appearance and feel of your breasts and may include breast self-examination. Any changes detected, no matter how small, should be reported to a health care provider. Women in their 57s and 30s should have a clinical breast exam (CBE) by a health care provider as part of a regular health exam every 1 to 3 years. After age 98, women should have a CBE every year. Starting at age 107, women should consider having a mammogram (breast X-ray test) every year. Women who have a family history of breast cancer should talk to their health care provider about genetic screening. Women at a high risk  of breast cancer should talk to their health care providers about having an MRI and a mammogram every year.  Breast cancer gene (BRCA)-related cancer risk assessment is recommended for women who have family members with BRCA-related cancers. BRCA-related cancers include breast, ovarian, tubal, and peritoneal cancers. Having family members with these cancers may be associated with an increased risk for harmful changes (mutations) in the breast cancer genes BRCA1 and BRCA2. Results of the assessment will determine the need for genetic counseling and BRCA1 and BRCA2 testing.  Routine pelvic exams to screen for cancer are no longer recommended for nonpregnant women who are considered low risk for cancer of the pelvic organs (ovaries, uterus, and vagina) and who do not have symptoms. Ask your health care provider if a screening pelvic exam is right for you.  If you have had past treatment for cervical cancer or a condition that could lead to cancer, you need Pap tests and  screening for cancer for at least 20 years after your treatment. If Pap tests have been discontinued, your risk factors (such as having a new sexual partner) need to be reassessed to determine if screening should be resumed. Some women have medical problems that increase the chance of getting cervical cancer. In these cases, your health care provider may recommend more frequent screening and Pap tests.  The HPV test is an additional test that may be used for cervical cancer screening. The HPV test looks for the virus that can cause the cell changes on the cervix. The cells collected during the Pap test can be tested for HPV. The HPV test could be used to screen women aged 44 years and older, and should be used in women of any age who have unclear Pap test results. After the age of 58, women should have HPV testing at the same frequency as a Pap test.  Colorectal cancer can be detected and often prevented. Most routine colorectal cancer screening begins at the age of 94 years and continues through age 64 years. However, your health care provider may recommend screening at an earlier age if you have risk factors for colon cancer. On a yearly basis, your health care provider may provide home test kits to check for hidden blood in the stool. Use of a small camera at the end of a tube, to directly examine the colon (sigmoidoscopy or colonoscopy), can detect the earliest forms of colorectal cancer. Talk to your health care provider about this at age 74, when routine screening begins. Direct exam of the colon should be repeated every 5-10 years through age 26 years, unless early forms of pre-cancerous polyps or small growths are found.  People who are at an increased risk for hepatitis B should be screened for this virus. You are considered at high risk for hepatitis B if:  You were born in a country where hepatitis B occurs often. Talk with your health care provider about which countries are considered high  risk.  Your parents were born in a high-risk country and you have not received a shot to protect against hepatitis B (hepatitis B vaccine).  You have HIV or AIDS.  You use needles to inject street drugs.  You live with, or have sex with, someone who has hepatitis B.  You get hemodialysis treatment.  You take certain medicines for conditions like cancer, organ transplantation, and autoimmune conditions.  Hepatitis C blood testing is recommended for all people born from [birth year] through 1966 and any individual with known risks for  hepatitis C.  Practice safe sex. Use condoms and avoid high-risk sexual practices to reduce the spread of sexually transmitted infections (STIs). STIs include gonorrhea, chlamydia, syphilis, trichomonas, herpes, HPV, and human immunodeficiency virus (HIV). Herpes, HIV, and HPV are viral illnesses that have no cure. They can result in disability, cancer, and death.  You should be screened for sexually transmitted illnesses (STIs) including gonorrhea and chlamydia if:  You are sexually active and are younger than 24 years.  You are older than 24 years and your health care provider tells you that you are at risk for this type of infection.  Your sexual activity has changed since you were last screened and you are at an increased risk for chlamydia or gonorrhea. Ask your health care provider if you are at risk.  If you are at risk of being infected with HIV, it is recommended that you take a prescription medicine daily to prevent HIV infection. This is called preexposure prophylaxis (PrEP). You are considered at risk if:  You are a heterosexual woman, are sexually active, and are at increased risk for HIV infection.  You take drugs by injection.  You are sexually active with a partner who has HIV.  Talk with your health care provider about whether you are at high risk of being infected with HIV. If you choose to begin PrEP, you should first be tested for HIV. You  should then be tested every 3 months for as long as you are taking PrEP.  Osteoporosis is a disease in which the bones lose minerals and strength with aging. This can result in serious bone fractures or breaks. The risk of osteoporosis can be identified using a bone density scan. Women ages 4 years and over and women at risk for fractures or osteoporosis should discuss screening with their health care providers. Ask your health care provider whether you should take a calcium supplement or vitamin D to reduce the rate of osteoporosis.  Menopause can be associated with physical symptoms and risks. Hormone replacement therapy is available to decrease symptoms and risks. You should talk to your health care provider about whether hormone replacement therapy is right for you.  Use sunscreen. Apply sunscreen liberally and repeatedly throughout the day. You should seek shade when your shadow is shorter than you. Protect yourself by wearing long sleeves, pants, a wide-brimmed hat, and sunglasses year round, whenever you are outdoors.  Once a month, do a whole body skin exam, using a mirror to look at the skin on your back. Tell your health care provider of new moles, moles that have irregular borders, moles that are larger than a pencil eraser, or moles that have changed in shape or color.  Stay current with required vaccines (immunizations).  Influenza vaccine. All adults should be immunized every year.  Tetanus, diphtheria, and acellular pertussis (Td, Tdap) vaccine. Pregnant women should receive 1 dose of Tdap vaccine during each pregnancy. The dose should be obtained regardless of the length of time since the last dose. Immunization is preferred during the 27th-36th week of gestation. An adult who has not previously received Tdap or who does not know her vaccine status should receive 1 dose of Tdap. This initial dose should be followed by tetanus and diphtheria toxoids (Td) booster doses every 10 years.  Adults with an unknown or incomplete history of completing a 3-dose immunization series with Td-containing vaccines should begin or complete a primary immunization series including a Tdap dose. Adults should receive a Td booster every  10 years.  Varicella vaccine. An adult without evidence of immunity to varicella should receive 2 doses or a second dose if she has previously received 1 dose. Pregnant females who do not have evidence of immunity should receive the first dose after pregnancy. This first dose should be obtained before leaving the health care facility. The second dose should be obtained 4-8 weeks after the first dose.  Human papillomavirus (HPV) vaccine. Females aged 13-26 years who have not received the vaccine previously should obtain the 3-dose series. The vaccine is not recommended for use in pregnant females. However, pregnancy testing is not needed before receiving a dose. If a female is found to be pregnant after receiving a dose, no treatment is needed. In that case, the remaining doses should be delayed until after the pregnancy. Immunization is recommended for any person with an immunocompromised condition through the age of 30 years if she did not get any or all doses earlier. During the 3-dose series, the second dose should be obtained 4-8 weeks after the first dose. The third dose should be obtained 24 weeks after the first dose and 16 weeks after the second dose.  Zoster vaccine. One dose is recommended for adults aged 59 years or older unless certain conditions are present.  Measles, mumps, and rubella (MMR) vaccine. Adults born before [birth year] generally are considered immune to measles and mumps. Adults born in [birth year] or later should have 1 or more doses of MMR vaccine unless there is a contraindication to the vaccine or there is laboratory evidence of immunity to each of the three diseases. A routine second dose of MMR vaccine should be obtained at least 28 days after the first dose  for students attending postsecondary schools, health care workers, or international travelers. People who received inactivated measles vaccine or an unknown type of measles vaccine during 1963-1967 should receive 2 doses of MMR vaccine. People who received inactivated mumps vaccine or an unknown type of mumps vaccine before 1979 and are at high risk for mumps infection should consider immunization with 2 doses of MMR vaccine. For females of childbearing age, rubella immunity should be determined. If there is no evidence of immunity, females who are not pregnant should be vaccinated. If there is no evidence of immunity, females who are pregnant should delay immunization until after pregnancy. Unvaccinated health care workers born before [birth year] who lack laboratory evidence of measles, mumps, or rubella immunity or laboratory confirmation of disease should consider measles and mumps immunization with 2 doses of MMR vaccine or rubella immunization with 1 dose of MMR vaccine.  Pneumococcal 13-valent conjugate (PCV13) vaccine. When indicated, a person who is uncertain of her immunization history and has no record of immunization should receive the PCV13 vaccine. An adult aged 34 years or older who has certain medical conditions and has not been previously immunized should receive 1 dose of PCV13 vaccine. This PCV13 should be followed with a dose of pneumococcal polysaccharide (PPSV23) vaccine. The PPSV23 vaccine dose should be obtained at least 8 weeks after the dose of PCV13 vaccine. An adult aged 54 years or older who has certain medical conditions and previously received 1 or more doses of PPSV23 vaccine should receive 1 dose of PCV13. The PCV13 vaccine dose should be obtained 1 or more years after the last PPSV23 vaccine dose.  Pneumococcal polysaccharide (PPSV23) vaccine. When PCV13 is also indicated, PCV13 should be obtained first. All adults aged 74 years and older should be immunized. An adult younger than age  65 years who has certain medical conditions should be immunized. Any person who resides in a nursing home or long-term care facility should be immunized. An adult smoker should be immunized. People with an immunocompromised condition and certain other conditions should receive both PCV13 and PPSV23 vaccines. People with human immunodeficiency virus (HIV) infection should be immunized as soon as possible after diagnosis. Immunization during chemotherapy or radiation therapy should be avoided. Routine use of PPSV23 vaccine is not recommended for American Indians, [place] Natives, or people younger than 65 years unless there are medical conditions that require PPSV23 vaccine. When indicated, people who have unknown immunization and have no record of immunization should receive PPSV23 vaccine. One-time revaccination 5 years after the first dose of PPSV23 is recommended for people aged 19-64 years who have chronic kidney failure, nephrotic syndrome, asplenia, or immunocompromised conditions. People who received 1-2 doses of PPSV23 before age 2 years should receive another dose of PPSV23 vaccine at age 63 years or later if at least 5 years have passed since the previous dose. Doses of PPSV23 are not needed for people immunized with PPSV23 at or after age 65 years.  Meningococcal vaccine. Adults with asplenia or persistent complement component deficiencies should receive 2 doses of quadrivalent meningococcal conjugate (MenACWY-D) vaccine. The doses should be obtained at least 2 months apart. Microbiologists working with certain meningococcal bacteria, [place] recruits, people at risk during an outbreak, and people who travel to or live in countries with a high rate of meningitis should be immunized. A first-year college student up through age 35 years who is living in a residence hall should receive a dose if she did not receive a dose on or after her 16th birthday. Adults who have certain high-risk conditions should  receive one or more doses of vaccine.  Hepatitis A vaccine. Adults who wish to be protected from this disease, have certain high-risk conditions, work with hepatitis A-infected animals, work in hepatitis A research labs, or travel to or work in countries with a high rate of hepatitis A should be immunized. Adults who were previously unvaccinated and who anticipate close contact with an international adoptee during the first 60 days after arrival in the [place] from a country with a high rate of hepatitis A should be immunized.  Hepatitis B vaccine. Adults who wish to be protected from this disease, have certain high-risk conditions, may be exposed to blood or other infectious body fluids, are household contacts or sex partners of hepatitis B positive people, are clients or workers in certain care facilities, or travel to or work in countries with a high rate of hepatitis B should be immunized.  Haemophilus influenzae type b (Hib) vaccine. A previously unvaccinated person with asplenia or sickle cell disease or having a scheduled splenectomy should receive 1 dose of Hib vaccine. Regardless of previous immunization, a recipient of a hematopoietic stem cell transplant should receive a 3-dose series 6-12 months after her successful transplant. Hib vaccine is not recommended for adults with HIV infection. Preventive Services / Frequency Ages 70 to 8 years  Blood pressure check.** / Every 1 to 2 years.  Lipid and cholesterol check.** / Every 5 years beginning at age 1.  Clinical breast exam.** / Every 3 years for women in their 71s and 11s.  BRCA-related cancer risk assessment.** / For women who have family members with a BRCA-related cancer (breast, ovarian, tubal, or peritoneal cancers).  Pap test.** / Every 2 years from ages 48 through 88. Every 3 years  starting at age 64 through age 3 or 46 with a history of 3 consecutive normal Pap tests.  HPV screening.** / Every 3 years from ages 50  through ages 79 to 25 with a history of 3 consecutive normal Pap tests.  Hepatitis C blood test.** / For any individual with known risks for hepatitis C.  Skin self-exam. / Monthly.  Influenza vaccine. / Every year.  Tetanus, diphtheria, and acellular pertussis (Tdap, Td) vaccine.** / Consult your health care provider. Pregnant women should receive 1 dose of Tdap vaccine during each pregnancy. 1 dose of Td every 10 years.  Varicella vaccine.** / Consult your health care provider. Pregnant females who do not have evidence of immunity should receive the first dose after pregnancy.  HPV vaccine. / 3 doses over 6 months, if 63 and younger. The vaccine is not recommended for use in pregnant females. However, pregnancy testing is not needed before receiving a dose.  Measles, mumps, rubella (MMR) vaccine.** / You need at least 1 dose of MMR if you were born in 1957 or later. You may also need a 2nd dose. For females of childbearing age, rubella immunity should be determined. If there is no evidence of immunity, females who are not pregnant should be vaccinated. If there is no evidence of immunity, females who are pregnant should delay immunization until after pregnancy.  Pneumococcal 13-valent conjugate (PCV13) vaccine.** / Consult your health care provider.  Pneumococcal polysaccharide (PPSV23) vaccine.** / 1 to 2 doses if you smoke cigarettes or if you have certain conditions.  Meningococcal vaccine.** / 1 dose if you are age 78 to 62 years and a Market researcher living in a residence hall, or have one of several medical conditions, you need to get vaccinated against meningococcal disease. You may also need additional booster doses.  Hepatitis A vaccine.** / Consult your health care provider.  Hepatitis B vaccine.** / Consult your health care provider.  Haemophilus influenzae type b (Hib) vaccine.** / Consult your health care provider. Ages 75 to 8 years  Blood pressure check.** /  Every 1 to 2 years.  Lipid and cholesterol check.** / Every 5 years beginning at age 18 years.  Lung cancer screening. / Every year if you are aged 34-80 years and have a 30-pack-year history of smoking and currently smoke or have quit within the past 15 years. Yearly screening is stopped once you have quit smoking for at least 15 years or develop a health problem that would prevent you from having lung cancer treatment.  Clinical breast exam.** / Every year after age 76 years.  BRCA-related cancer risk assessment.** / For women who have family members with a BRCA-related cancer (breast, ovarian, tubal, or peritoneal cancers).  Mammogram.** / Every year beginning at age 15 years and continuing for as long as you are in good health. Consult with your health care provider.  Pap test.** / Every 3 years starting at age 16 years through age 34 or 28 years with a history of 3 consecutive normal Pap tests.  HPV screening.** / Every 3 years from ages 31 years through ages 74 to 46 years with a history of 3 consecutive normal Pap tests.  Fecal occult blood test (FOBT) of stool. / Every year beginning at age 66 years and continuing until age 77 years. You may not need to do this test if you get a colonoscopy every 10 years.  Flexible sigmoidoscopy or colonoscopy.** / Every 5 years for a flexible sigmoidoscopy or every 10  years for a colonoscopy beginning at age 68 years and continuing until age 45 years.  Hepatitis C blood test.** / For all people born from [birth year] through 1966 and any individual with known risks for hepatitis C.  Skin self-exam. / Monthly.  Influenza vaccine. / Every year.  Tetanus, diphtheria, and acellular pertussis (Tdap/Td) vaccine.** / Consult your health care provider. Pregnant women should receive 1 dose of Tdap vaccine during each pregnancy. 1 dose of Td every 10 years.  Varicella vaccine.** / Consult your health care provider. Pregnant females who do not have evidence of  immunity should receive the first dose after pregnancy.  Zoster vaccine.** / 1 dose for adults aged 48 years or older.  Measles, mumps, rubella (MMR) vaccine.** / You need at least 1 dose of MMR if you were born in 1957 or later. You may also need a 2nd dose. For females of childbearing age, rubella immunity should be determined. If there is no evidence of immunity, females who are not pregnant should be vaccinated. If there is no evidence of immunity, females who are pregnant should delay immunization until after pregnancy.  Pneumococcal 13-valent conjugate (PCV13) vaccine.** / Consult your health care provider.  Pneumococcal polysaccharide (PPSV23) vaccine.** / 1 to 2 doses if you smoke cigarettes or if you have certain conditions.  Meningococcal vaccine.** / Consult your health care provider.  Hepatitis A vaccine.** / Consult your health care provider.  Hepatitis B vaccine.** / Consult your health care provider.  Haemophilus influenzae type b (Hib) vaccine.** / Consult your health care provider. Ages 69 years and over  Blood pressure check.** / Every 1 to 2 years.  Lipid and cholesterol check.** / Every 5 years beginning at age 24 years.  Lung cancer screening. / Every year if you are aged 69-80 years and have a 30-pack-year history of smoking and currently smoke or have quit within the past 15 years. Yearly screening is stopped once you have quit smoking for at least 15 years or develop a health problem that would prevent you from having lung cancer treatment.  Clinical breast exam.** / Every year after age 60 years.  BRCA-related cancer risk assessment.** / For women who have family members with a BRCA-related cancer (breast, ovarian, tubal, or peritoneal cancers).  Mammogram.** / Every year beginning at age 78 years and continuing for as long as you are in good health. Consult with your health care provider.  Pap test.** / Every 3 years starting at age 82 years through age 39 or  32 years with 3 consecutive normal Pap tests. Testing can be stopped between 65 and 70 years with 3 consecutive normal Pap tests and no abnormal Pap or HPV tests in the past 10 years.  HPV screening.** / Every 3 years from ages 75 years through ages 64 or 44 years with a history of 3 consecutive normal Pap tests. Testing can be stopped between 65 and 70 years with 3 consecutive normal Pap tests and no abnormal Pap or HPV tests in the past 10 years.  Fecal occult blood test (FOBT) of stool. / Every year beginning at age 26 years and continuing until age 74 years. You may not need to do this test if you get a colonoscopy every 10 years.  Flexible sigmoidoscopy or colonoscopy.** / Every 5 years for a flexible sigmoidoscopy or every 10 years for a colonoscopy beginning at age 29 years and continuing until age 51 years.  Hepatitis C blood test.** / For all people born from  1946 through 1965 and any individual with known risks for hepatitis C.  Osteoporosis screening.** / A one-time screening for women ages 75 years and over and women at risk for fractures or osteoporosis.  Skin self-exam. / Monthly.  Influenza vaccine. / Every year.  Tetanus, diphtheria, and acellular pertussis (Tdap/Td) vaccine.** / 1 dose of Td every 10 years.  Varicella vaccine.** / Consult your health care provider.  Zoster vaccine.** / 1 dose for adults aged 49 years or older.  Pneumococcal 13-valent conjugate (PCV13) vaccine.** / Consult your health care provider.  Pneumococcal polysaccharide (PPSV23) vaccine.** / 1 dose for all adults aged 48 years and older.  Meningococcal vaccine.** / Consult your health care provider.  Hepatitis A vaccine.** / Consult your health care provider.  Hepatitis B vaccine.** / Consult your health care provider.  Haemophilus influenzae type b (Hib) vaccine.** / Consult your health care provider. ** Family history and personal history of risk and conditions may change your health care  provider's recommendations. Document Released: 01/07/2002 Document Revised: 03/28/2014 Document Reviewed: 04/08/2011 [facility] Patient Information 2015 [place], [place]. This information is not intended to replace advice given to you by your health care provider. Make sure you discuss any questions you have with your health care provider.

## 2015-03-08 ENCOUNTER — Ambulatory Visit: Payer: Managed Care, Other (non HMO) | Admitting: Internal Medicine

## 2015-03-08 LAB — CYTOLOGY - PAP

## 2016-03-07 ENCOUNTER — Emergency Department (HOSPITAL_COMMUNITY)
Admission: EM | Admit: 2016-03-07 | Discharge: 2016-03-07 | Disposition: A | Discharge status: Home / Self Care | Payer: Managed Care, Other (non HMO) | Attending: Emergency Medicine | Admitting: Emergency Medicine

## 2016-03-07 ENCOUNTER — Emergency Department (HOSPITAL_COMMUNITY): Payer: Managed Care, Other (non HMO)

## 2016-03-07 ENCOUNTER — Encounter (HOSPITAL_COMMUNITY): Payer: Self-pay | Admitting: Emergency Medicine

## 2016-03-07 DIAGNOSIS — Z8719 Personal history of other diseases of the digestive system: Secondary | ICD-10-CM | POA: Insufficient documentation

## 2016-03-07 DIAGNOSIS — F329 Major depressive disorder, single episode, unspecified: Secondary | ICD-10-CM | POA: Insufficient documentation

## 2016-03-07 DIAGNOSIS — F419 Anxiety disorder, unspecified: Secondary | ICD-10-CM | POA: Diagnosis not present

## 2016-03-07 DIAGNOSIS — R079 Chest pain, unspecified: Secondary | ICD-10-CM | POA: Diagnosis not present

## 2016-03-07 DIAGNOSIS — I10 Essential (primary) hypertension: Secondary | ICD-10-CM | POA: Insufficient documentation

## 2016-03-07 LAB — CBC
HEMATOCRIT: 38.8 % (ref 36.0–46.0)
HEMOGLOBIN: 12.9 g/dL (ref 12.0–15.0)
MCH: 29 pg (ref 26.0–34.0)
MCHC: 33.2 g/dL (ref 30.0–36.0)
MCV: 87.2 fL (ref 78.0–100.0)
Platelets: 222 10*3/uL (ref 150–400)
RBC: 4.45 MIL/uL (ref 3.87–5.11)
RDW: 13 % (ref 11.5–15.5)
WBC: 4.9 10*3/uL (ref 4.0–10.5)

## 2016-03-07 LAB — BASIC METABOLIC PANEL
ANION GAP: 8 (ref 5–15)
BUN: 10 mg/dL (ref 6–20)
CHLORIDE: 107 mmol/L (ref 101–111)
CO2: 26 mmol/L (ref 22–32)
Calcium: 9.2 mg/dL (ref 8.9–10.3)
Creatinine, Ser: 0.81 mg/dL (ref 0.44–1.00)
GFR calc Af Amer: >60 mL/min (ref >60)
GLUCOSE: 94 mg/dL (ref 65–99)
POTASSIUM: 3.7 mmol/L (ref 3.5–5.1)
Sodium: 141 mmol/L (ref 135–145)

## 2016-03-07 LAB — I-STAT TROPONIN, ED: Troponin i, poc: 0 ng/mL (ref 0.00–0.08)

## 2016-03-07 MED ORDER — KETOROLAC TROMETHAMINE 60 MG/2ML IM SOLN
60.0000 mg | Freq: Once | INTRAMUSCULAR | Status: AC
  Administered 2016-03-07: 60 mg via INTRAMUSCULAR
  Filled 2016-03-07: qty 2

## 2016-03-07 NOTE — ED Provider Notes (Signed)
CSN: [id number]     Arrival date & time 03/07/16  1427 History   First MD Initiated Contact with Patient 03/07/16 1617     Chief Complaint  Patient presents with  . Chest Pain     (Consider location/radiation/quality/duration/timing/severity/associated sxs/prior Treatment) Patient is a 35 y.o. female presenting with chest pain.  Chest Pain Pain location:  R chest Pain quality: sharp   Pain radiates to:  Does not radiate Pain radiates to the back: no   Pain severity:  Mild Timing:  Constant Chronicity:  New Context: movement   Context: no drug use   Associated symptoms: no cough, no fatigue, no fever, no palpitations and no shortness of breath     Past Medical History  Diagnosis Date  . Hypertension     PIH with last pregnancy  . Anxiety and depression   . IBS (irritable bowel syndrome)    Past Surgical History  Procedure Laterality Date  . Induced abortion  08/2011   Family History  Problem Relation Age of Onset  . Anesthesia problems Neg Hx   . Diabetes Maternal Grandmother   . Diabetes Maternal Uncle    Social History  Substance Use Topics  . Smoking status: Never Smoker   . Smokeless tobacco: Never Used  . Alcohol Use: No   OB History    Gravida Para Term Preterm AB TAB SAB Ectopic Multiple Living   0 0 0 2     Review of Systems  Constitutional: Negative for fever, chills and fatigue.  Respiratory: Negative for cough and shortness of breath.   Cardiovascular: Positive for chest pain. Negative for palpitations and leg swelling.  Endocrine: Negative for polydipsia and polyuria.  Genitourinary: Negative for dysuria and flank pain.  All other systems reviewed and are negative.     Allergies  Review of patient's allergies indicates no known allergies.  Home Medications   Prior to Admission medications   Medication Sig Start Date End Date Taking? Authorizing Provider  traMADol (ULTRAM) 50 MG tablet Take 1 tablet (50 mg total) by mouth  every 8 (eight) hours as needed. 01/27/14   Elenora Gamma, MD   BP 129/85 mmHg  Pulse 70  Temp(Src) 97.9 F (36.6 C) (Oral)  Resp 17  SpO2 100%  LMP 03/06/2016 Physical Exam  Constitutional: She appears well-developed and well-nourished.  HENT:  Head: Normocephalic and atraumatic.  Neck: Normal range of motion.  Cardiovascular: Normal rate and regular rhythm.   Pulmonary/Chest: No stridor. No respiratory distress. She exhibits tenderness (right mid clavicular, mid chest).  Abdominal: She exhibits no distension.  Neurological: She is alert.  Nursing note and vitals reviewed.   ED Course  Procedures (including critical care time) Labs Review Labs Reviewed  BASIC METABOLIC PANEL  CBC  I-STAT TROPOININ, ED    Imaging Review Dg Chest 2 View  03/07/2016  CLINICAL DATA:  Chest pain EXAM: CHEST  2 VIEW COMPARISON:  07/02/2008 FINDINGS: The heart size and mediastinal contours are within normal limits. Both lungs are clear. The visualized skeletal structures are unremarkable. IMPRESSION: No active cardiopulmonary disease. Electronically Signed   By: Marlan Palau M.D.   On: 03/07/2016 15:06   I have personally reviewed and evaluated these images and lab results as part of my medical decision-making.   EKG Interpretation   Date/Time:  Thursday March 07 2016 14:36:14 EDT Ventricular Rate:  72 PR Interval:  151 QRS Duration: 94 QT Interval:  394 QTC Calculation: 431  R Axis:   39 Text Interpretation:  Sinus rhythm Low voltage, precordial leads  Borderline T abnormalities, anterior leads Baseline wander in lead(s) V6  inverted T waves improved since august 2009 Confirmed by [facility] MD, Barbara CowerJASON  [phone]) on 03/07/2016 4:29:15 PM      MDM   Final diagnoses:  Chest pain, unspecified chest pain type    Atypical CP, seems c/w MSK type. Planned to get second troponin, however patient refused. Discussed that we could miss a life threatening issue and seh wanted to leave so was  discharged. Symptoms had improved prior to this conversation.     Marily MemosJason Eriel Dunckel, MD 03/07/16 [phone]

## 2016-03-07 NOTE — ED Notes (Signed)
Chest pain intermittent for 2 weeks. Woke her out of sleeping last night. TUMS relieves momentarily but then the pain returns. Central chest radiating into right shoulder. Nausea, no vomiting or SOB

## 2016-04-08 ENCOUNTER — Ambulatory Visit (INDEPENDENT_AMBULATORY_CARE_PROVIDER_SITE_OTHER): Payer: Managed Care, Other (non HMO) | Admitting: Obstetrics & Gynecology

## 2016-04-08 ENCOUNTER — Encounter: Payer: Self-pay | Admitting: Obstetrics & Gynecology

## 2016-04-08 VITALS — BP 111/76 | HR 75 | Ht 72.0 in | Wt 227.8 lb

## 2016-04-08 DIAGNOSIS — Z01419 Encounter for gynecological examination (general) (routine) without abnormal findings: Secondary | ICD-10-CM

## 2016-04-08 DIAGNOSIS — Z1151 Encounter for screening for human papillomavirus (HPV): Secondary | ICD-10-CM | POA: Diagnosis not present

## 2016-04-08 DIAGNOSIS — N760 Acute vaginitis: Secondary | ICD-10-CM | POA: Diagnosis not present

## 2016-04-08 DIAGNOSIS — N898 Other specified noninflammatory disorders of vagina: Secondary | ICD-10-CM | POA: Diagnosis not present

## 2016-04-08 DIAGNOSIS — Z113 Encounter for screening for infections with a predominantly sexual mode of transmission: Secondary | ICD-10-CM | POA: Diagnosis not present

## 2016-04-08 DIAGNOSIS — Z124 Encounter for screening for malignant neoplasm of cervix: Secondary | ICD-10-CM

## 2016-04-08 DIAGNOSIS — B9689 Other specified bacterial agents as the cause of diseases classified elsewhere: Secondary | ICD-10-CM

## 2016-04-08 DIAGNOSIS — A499 Bacterial infection, unspecified: Secondary | ICD-10-CM

## 2016-04-08 NOTE — Patient Instructions (Signed)
Preventive Care for Adults, Female A healthy lifestyle and preventive care can promote health and wellness. Preventive health guidelines for women include the following key practices.  A routine yearly physical is a good way to check with your health care provider about your health and preventive screening. It is a chance to share any concerns and updates on your health and to receive a thorough exam.  Visit your dentist for a routine exam and preventive care every 6 months. Brush your teeth twice a day and floss once a day. Good oral hygiene prevents tooth decay and gum disease.  The frequency of eye exams is based on your age, health, family medical history, use of contact lenses, and other factors. Follow your health care provider's recommendations for frequency of eye exams.  Eat a healthy diet. Foods like vegetables, fruits, whole grains, low-fat dairy products, and lean protein foods contain the nutrients you need without too many calories. Decrease your intake of foods high in solid fats, added sugars, and salt. Eat the right amount of calories for you.Get information about a proper diet from your health care provider, if necessary.  Regular physical exercise is one of the most important things you can do for your health. Most adults should get at least 150 minutes of moderate-intensity exercise (any activity that increases your heart rate and causes you to sweat) each week. In addition, most adults need muscle-strengthening exercises on 2 or more days a week.  Maintain a healthy weight. The body mass index (BMI) is a screening tool to identify possible weight problems. It provides an estimate of body fat based on height and weight. Your health care provider can find your BMI and can help you achieve or maintain a healthy weight.For adults 20 years and older:  A BMI below 18.5 is considered underweight.  A BMI of 18.5 to 24.9 is normal.  A BMI of 25 to 29.9 is considered overweight.  A  BMI of 30 and above is considered obese.  Maintain normal blood lipids and cholesterol levels by exercising and minimizing your intake of saturated fat. Eat a balanced diet with plenty of fruit and vegetables. Blood tests for lipids and cholesterol should begin at age 45 and be repeated every 5 years. If your lipid or cholesterol levels are high, you are over 50, or you are at high risk for heart disease, you may need your cholesterol levels checked more frequently.Ongoing high lipid and cholesterol levels should be treated with medicines if diet and exercise are not working.  If you smoke, find out from your health care provider how to quit. If you do not use tobacco, do not start.  Lung cancer screening is recommended for adults aged 45-80 years who are at high risk for developing lung cancer because of a history of smoking. A yearly low-dose CT scan of the lungs is recommended for people who have at least a 30-pack-year history of smoking and are a current smoker or have quit within the past 15 years. A pack year of smoking is smoking an average of 1 pack of cigarettes a day for 1 year (for example: 1 pack a day for 30 years or 2 packs a day for 15 years). Yearly screening should continue until the smoker has stopped smoking for at least 15 years. Yearly screening should be stopped for people who develop a health problem that would prevent them from having lung cancer treatment.  If you are pregnant, do not drink alcohol. If you are  breastfeeding, be very cautious about drinking alcohol. If you are not pregnant and choose to drink alcohol, do not have more than 1 drink per day. One drink is considered to be 12 ounces (355 mL) of beer, 5 ounces (148 mL) of wine, or 1.5 ounces (44 mL) of liquor.  Avoid use of street drugs. Do not share needles with anyone. Ask for help if you need support or instructions about stopping the use of drugs.  High blood pressure causes heart disease and increases the risk  of stroke. Your blood pressure should be checked at least every 1 to 2 years. Ongoing high blood pressure should be treated with medicines if weight loss and exercise do not work.  If you are 55-79 years old, ask your health care provider if you should take aspirin to prevent strokes.  Diabetes screening is done by taking a blood sample to check your blood glucose level after you have not eaten for a certain period of time (fasting). If you are not overweight and you do not have risk factors for diabetes, you should be screened once every 3 years starting at age 45. If you are overweight or obese and you are 40-70 years of age, you should be screened for diabetes every year as part of your cardiovascular risk assessment.  Breast cancer screening is essential preventive care for women. You should practice "breast self-awareness." This means understanding the normal appearance and feel of your breasts and may include breast self-examination. Any changes detected, no matter how small, should be reported to a health care provider. Women in their 20s and 30s should have a clinical breast exam (CBE) by a health care provider as part of a regular health exam every 1 to 3 years. After age 40, women should have a CBE every year. Starting at age 40, women should consider having a mammogram (breast X-ray test) every year. Women who have a family history of breast cancer should talk to their health care provider about genetic screening. Women at a high risk of breast cancer should talk to their health care providers about having an MRI and a mammogram every year.  Breast cancer gene (BRCA)-related cancer risk assessment is recommended for women who have family members with BRCA-related cancers. BRCA-related cancers include breast, ovarian, tubal, and peritoneal cancers. Having family members with these cancers may be associated with an increased risk for harmful changes (mutations) in the breast cancer genes BRCA1 and  BRCA2. Results of the assessment will determine the need for genetic counseling and BRCA1 and BRCA2 testing.  Your health care provider may recommend that you be screened regularly for cancer of the pelvic organs (ovaries, uterus, and vagina). This screening involves a pelvic examination, including checking for microscopic changes to the surface of your cervix (Pap test). You may be encouraged to have this screening done every 3 years, beginning at age 21.  For women ages 30-65, health care providers may recommend pelvic exams and Pap testing every 3 years, or they may recommend the Pap and pelvic exam, combined with testing for human papilloma virus (HPV), every 5 years. Some types of HPV increase your risk of cervical cancer. Testing for HPV may also be done on women of any age with unclear Pap test results.  Other health care providers may not recommend any screening for nonpregnant women who are considered low risk for pelvic cancer and who do not have symptoms. Ask your health care provider if a screening pelvic exam is right for   you.  If you have had past treatment for cervical cancer or a condition that could lead to cancer, you need Pap tests and screening for cancer for at least 20 years after your treatment. If Pap tests have been discontinued, your risk factors (such as having a new sexual partner) need to be reassessed to determine if screening should resume. Some women have medical problems that increase the chance of getting cervical cancer. In these cases, your health care provider may recommend more frequent screening and Pap tests.  Colorectal cancer can be detected and often prevented. Most routine colorectal cancer screening begins at the age of 50 years and continues through age 75 years. However, your health care provider may recommend screening at an earlier age if you have risk factors for colon cancer. On a yearly basis, your health care provider may provide home test kits to check  for hidden blood in the stool. Use of a small camera at the end of a tube, to directly examine the colon (sigmoidoscopy or colonoscopy), can detect the earliest forms of colorectal cancer. Talk to your health care provider about this at age 50, when routine screening begins. Direct exam of the colon should be repeated every 5-10 years through age 75 years, unless early forms of precancerous polyps or small growths are found.  People who are at an increased risk for hepatitis B should be screened for this virus. You are considered at high risk for hepatitis B if:  You were born in a country where hepatitis B occurs often. Talk with your health care provider about which countries are considered high risk.  Your parents were born in a high-risk country and you have not received a shot to protect against hepatitis B (hepatitis B vaccine).  You have HIV or AIDS.  You use needles to inject street drugs.  You live with, or have sex with, someone who has hepatitis B.  You get hemodialysis treatment.  You take certain medicines for conditions like cancer, organ transplantation, and autoimmune conditions.  Hepatitis C blood testing is recommended for all people born from 1945 through 1965 and any individual with known risks for hepatitis C.  Practice safe sex. Use condoms and avoid high-risk sexual practices to reduce the spread of sexually transmitted infections (STIs). STIs include gonorrhea, chlamydia, syphilis, trichomonas, herpes, HPV, and human immunodeficiency virus (HIV). Herpes, HIV, and HPV are viral illnesses that have no cure. They can result in disability, cancer, and death.  You should be screened for sexually transmitted illnesses (STIs) including gonorrhea and chlamydia if:  You are sexually active and are younger than 24 years.  You are older than 24 years and your health care provider tells you that you are at risk for this type of infection.  Your sexual activity has changed  since you were last screened and you are at an increased risk for chlamydia or gonorrhea. Ask your health care provider if you are at risk.  If you are at risk of being infected with HIV, it is recommended that you take a prescription medicine daily to prevent HIV infection. This is called preexposure prophylaxis (PrEP). You are considered at risk if:  You are sexually active and do not regularly use condoms or know the HIV status of your partner(s).  You take drugs by injection.  You are sexually active with a partner who has HIV.  Talk with your health care provider about whether you are at high risk of being infected with HIV. If   you choose to begin PrEP, you should first be tested for HIV. You should then be tested every 3 months for as long as you are taking PrEP.  Osteoporosis is a disease in which the bones lose minerals and strength with aging. This can result in serious bone fractures or breaks. The risk of osteoporosis can be identified using a bone density scan. Women ages 67 years and over and women at risk for fractures or osteoporosis should discuss screening with their health care providers. Ask your health care provider whether you should take a calcium supplement or vitamin D to reduce the rate of osteoporosis.  Menopause can be associated with physical symptoms and risks. Hormone replacement therapy is available to decrease symptoms and risks. You should talk to your health care provider about whether hormone replacement therapy is right for you.  Use sunscreen. Apply sunscreen liberally and repeatedly throughout the day. You should seek shade when your shadow is shorter than you. Protect yourself by wearing long sleeves, pants, a wide-brimmed hat, and sunglasses year round, whenever you are outdoors.  Once a month, do a whole body skin exam, using a mirror to look at the skin on your back. Tell your health care provider of new moles, moles that have irregular borders, moles that  are larger than a pencil eraser, or moles that have changed in shape or color.  Stay current with required vaccines (immunizations).  Influenza vaccine. All adults should be immunized every year.  Tetanus, diphtheria, and acellular pertussis (Td, Tdap) vaccine. Pregnant women should receive 1 dose of Tdap vaccine during each pregnancy. The dose should be obtained regardless of the length of time since the last dose. Immunization is preferred during the 27th-36th week of gestation. An adult who has not previously received Tdap or who does not know her vaccine status should receive 1 dose of Tdap. This initial dose should be followed by tetanus and diphtheria toxoids (Td) booster doses every 10 years. Adults with an unknown or incomplete history of completing a 3-dose immunization series with Td-containing vaccines should begin or complete a primary immunization series including a Tdap dose. Adults should receive a Td booster every 10 years.  Varicella vaccine. An adult without evidence of immunity to varicella should receive 2 doses or a second dose if she has previously received 1 dose. Pregnant females who do not have evidence of immunity should receive the first dose after pregnancy. This first dose should be obtained before leaving the health care facility. The second dose should be obtained 4-8 weeks after the first dose.  Human papillomavirus (HPV) vaccine. Females aged 13-26 years who have not received the vaccine previously should obtain the 3-dose series. The vaccine is not recommended for use in pregnant females. However, pregnancy testing is not needed before receiving a dose. If a female is found to be pregnant after receiving a dose, no treatment is needed. In that case, the remaining doses should be delayed until after the pregnancy. Immunization is recommended for any person with an immunocompromised condition through the age of 61 years if she did not get any or all doses earlier. During the  3-dose series, the second dose should be obtained 4-8 weeks after the first dose. The third dose should be obtained 24 weeks after the first dose and 16 weeks after the second dose.  Zoster vaccine. One dose is recommended for adults aged 30 years or older unless certain conditions are present.  Measles, mumps, and rubella (MMR) vaccine. Adults born  before 1957 generally are considered immune to measles and mumps. Adults born in 1957 or later should have 1 or more doses of MMR vaccine unless there is a contraindication to the vaccine or there is laboratory evidence of immunity to each of the three diseases. A routine second dose of MMR vaccine should be obtained at least 28 days after the first dose for students attending postsecondary schools, health care workers, or international travelers. People who received inactivated measles vaccine or an unknown type of measles vaccine during 1963-1967 should receive 2 doses of MMR vaccine. People who received inactivated mumps vaccine or an unknown type of mumps vaccine before 1979 and are at high risk for mumps infection should consider immunization with 2 doses of MMR vaccine. For females of childbearing age, rubella immunity should be determined. If there is no evidence of immunity, females who are not pregnant should be vaccinated. If there is no evidence of immunity, females who are pregnant should delay immunization until after pregnancy. Unvaccinated health care workers born before 1957 who lack laboratory evidence of measles, mumps, or rubella immunity or laboratory confirmation of disease should consider measles and mumps immunization with 2 doses of MMR vaccine or rubella immunization with 1 dose of MMR vaccine.  Pneumococcal 13-valent conjugate (PCV13) vaccine. When indicated, a person who is uncertain of his immunization history and has no record of immunization should receive the PCV13 vaccine. All adults 65 years of age and older should receive this  vaccine. An adult aged 19 years or older who has certain medical conditions and has not been previously immunized should receive 1 dose of PCV13 vaccine. This PCV13 should be followed with a dose of pneumococcal polysaccharide (PPSV23) vaccine. Adults who are at high risk for pneumococcal disease should obtain the PPSV23 vaccine at least 8 weeks after the dose of PCV13 vaccine. Adults older than 36 years of age who have normal immune system function should obtain the PPSV23 vaccine dose at least 1 year after the dose of PCV13 vaccine.  Pneumococcal polysaccharide (PPSV23) vaccine. When PCV13 is also indicated, PCV13 should be obtained first. All adults aged 65 years and older should be immunized. An adult younger than age 65 years who has certain medical conditions should be immunized. Any person who resides in a nursing home or long-term care facility should be immunized. An adult smoker should be immunized. People with an immunocompromised condition and certain other conditions should receive both PCV13 and PPSV23 vaccines. People with human immunodeficiency virus (HIV) infection should be immunized as soon as possible after diagnosis. Immunization during chemotherapy or radiation therapy should be avoided. Routine use of PPSV23 vaccine is not recommended for American Indians, [place] Natives, or people younger than 65 years unless there are medical conditions that require PPSV23 vaccine. When indicated, people who have unknown immunization and have no record of immunization should receive PPSV23 vaccine. One-time revaccination 5 years after the first dose of PPSV23 is recommended for people aged 19-64 years who have chronic kidney failure, nephrotic syndrome, asplenia, or immunocompromised conditions. People who received 1-2 doses of PPSV23 before age 65 years should receive another dose of PPSV23 vaccine at age 65 years or later if at least 5 years have passed since the previous dose. Doses of PPSV23 are not  needed for people immunized with PPSV23 at or after age 65 years.  Meningococcal vaccine. Adults with asplenia or persistent complement component deficiencies should receive 2 doses of quadrivalent meningococcal conjugate (MenACWY-D) vaccine. The doses should be obtained   at least 2 months apart. Microbiologists working with certain meningococcal bacteria, [place] recruits, people at risk during an outbreak, and people who travel to or live in countries with a high rate of meningitis should be immunized. A first-year college student up through age 34 years who is living in a residence hall should receive a dose if she did not receive a dose on or after her 16th birthday. Adults who have certain high-risk conditions should receive one or more doses of vaccine.  Hepatitis A vaccine. Adults who wish to be protected from this disease, have certain high-risk conditions, work with hepatitis A-infected animals, work in hepatitis A research labs, or travel to or work in countries with a high rate of hepatitis A should be immunized. Adults who were previously unvaccinated and who anticipate close contact with an international adoptee during the first 60 days after arrival in the [place] from a country with a high rate of hepatitis A should be immunized.  Hepatitis B vaccine. Adults who wish to be protected from this disease, have certain high-risk conditions, may be exposed to blood or other infectious body fluids, are household contacts or sex partners of hepatitis B positive people, are clients or workers in certain care facilities, or travel to or work in countries with a high rate of hepatitis B should be immunized.  Haemophilus influenzae type b (Hib) vaccine. A previously unvaccinated person with asplenia or sickle cell disease or having a scheduled splenectomy should receive 1 dose of Hib vaccine. Regardless of previous immunization, a recipient of a hematopoietic stem cell transplant should receive a  3-dose series 6-12 months after her successful transplant. Hib vaccine is not recommended for adults with HIV infection. Preventive Services / Frequency Ages 35 to 4 years  Blood pressure check.** / Every 3-5 years.  Lipid and cholesterol check.** / Every 5 years beginning at age 60.  Clinical breast exam.** / Every 3 years for women in their 71s and 10s.  BRCA-related cancer risk assessment.** / For women who have family members with a BRCA-related cancer (breast, ovarian, tubal, or peritoneal cancers).  Pap test.** / Every 2 years from ages 76 through 26. Every 3 years starting at age 61 through age 76 or 93 with a history of 3 consecutive normal Pap tests.  HPV screening.** / Every 3 years from ages 37 through ages 60 to 51 with a history of 3 consecutive normal Pap tests.  Hepatitis C blood test.** / For any individual with known risks for hepatitis C.  Skin self-exam. / Monthly.  Influenza vaccine. / Every year.  Tetanus, diphtheria, and acellular pertussis (Tdap, Td) vaccine.** / Consult your health care provider. Pregnant women should receive 1 dose of Tdap vaccine during each pregnancy. 1 dose of Td every 10 years.  Varicella vaccine.** / Consult your health care provider. Pregnant females who do not have evidence of immunity should receive the first dose after pregnancy.  HPV vaccine. / 3 doses over 6 months, if 93 and younger. The vaccine is not recommended for use in pregnant females. However, pregnancy testing is not needed before receiving a dose.  Measles, mumps, rubella (MMR) vaccine.** / You need at least 1 dose of MMR if you were born in 1957 or later. You may also need a 2nd dose. For females of childbearing age, rubella immunity should be determined. If there is no evidence of immunity, females who are not pregnant should be vaccinated. If there is no evidence of immunity, females who are  pregnant should delay immunization until after pregnancy.  Pneumococcal  13-valent conjugate (PCV13) vaccine.** / Consult your health care provider.  Pneumococcal polysaccharide (PPSV23) vaccine.** / 1 to 2 doses if you smoke cigarettes or if you have certain conditions.  Meningococcal vaccine.** / 1 dose if you are age 68 to 8 years and a Market researcher living in a residence hall, or have one of several medical conditions, you need to get vaccinated against meningococcal disease. You may also need additional booster doses.  Hepatitis A vaccine.** / Consult your health care provider.  Hepatitis B vaccine.** / Consult your health care provider.  Haemophilus influenzae type b (Hib) vaccine.** / Consult your health care provider. Ages 7 to 53 years  Blood pressure check.** / Every year.  Lipid and cholesterol check.** / Every 5 years beginning at age 25 years.  Lung cancer screening. / Every year if you are aged 11-80 years and have a 30-pack-year history of smoking and currently smoke or have quit within the past 15 years. Yearly screening is stopped once you have quit smoking for at least 15 years or develop a health problem that would prevent you from having lung cancer treatment.  Clinical breast exam.** / Every year after age 48 years.  BRCA-related cancer risk assessment.** / For women who have family members with a BRCA-related cancer (breast, ovarian, tubal, or peritoneal cancers).  Mammogram.** / Every year beginning at age 41 years and continuing for as long as you are in good health. Consult with your health care provider.  Pap test.** / Every 3 years starting at age 65 years through age 37 or 70 years with a history of 3 consecutive normal Pap tests.  HPV screening.** / Every 3 years from ages 72 years through ages 60 to 40 years with a history of 3 consecutive normal Pap tests.  Fecal occult blood test (FOBT) of stool. / Every year beginning at age 21 years and continuing until age 5 years. You may not need to do this test if you get  a colonoscopy every 10 years.  Flexible sigmoidoscopy or colonoscopy.** / Every 5 years for a flexible sigmoidoscopy or every 10 years for a colonoscopy beginning at age 35 years and continuing until age 48 years.  Hepatitis C blood test.** / For all people born from [birth year] through 1965 and any individual with known risks for hepatitis C.  Skin self-exam. / Monthly.  Influenza vaccine. / Every year.  Tetanus, diphtheria, and acellular pertussis (Tdap/Td) vaccine.** / Consult your health care provider. Pregnant women should receive 1 dose of Tdap vaccine during each pregnancy. 1 dose of Td every 10 years.  Varicella vaccine.** / Consult your health care provider. Pregnant females who do not have evidence of immunity should receive the first dose after pregnancy.  Zoster vaccine.** / 1 dose for adults aged 30 years or older.  Measles, mumps, rubella (MMR) vaccine.** / You need at least 1 dose of MMR if you were born in 1957 or later. You may also need a second dose. For females of childbearing age, rubella immunity should be determined. If there is no evidence of immunity, females who are not pregnant should be vaccinated. If there is no evidence of immunity, females who are pregnant should delay immunization until after pregnancy.  Pneumococcal 13-valent conjugate (PCV13) vaccine.** / Consult your health care provider.  Pneumococcal polysaccharide (PPSV23) vaccine.** / 1 to 2 doses if you smoke cigarettes or if you have certain conditions.  Meningococcal vaccine.** /  Consult your health care provider.  Hepatitis A vaccine.** / Consult your health care provider.  Hepatitis B vaccine.** / Consult your health care provider.  Haemophilus influenzae type b (Hib) vaccine.** / Consult your health care provider. Ages 64 years and over  Blood pressure check.** / Every year.  Lipid and cholesterol check.** / Every 5 years beginning at age 23 years.  Lung cancer screening. / Every year if you  are aged 16-80 years and have a 30-pack-year history of smoking and currently smoke or have quit within the past 15 years. Yearly screening is stopped once you have quit smoking for at least 15 years or develop a health problem that would prevent you from having lung cancer treatment.  Clinical breast exam.** / Every year after age 74 years.  BRCA-related cancer risk assessment.** / For women who have family members with a BRCA-related cancer (breast, ovarian, tubal, or peritoneal cancers).  Mammogram.** / Every year beginning at age 44 years and continuing for as long as you are in good health. Consult with your health care provider.  Pap test.** / Every 3 years starting at age 58 years through age 22 or 39 years with 3 consecutive normal Pap tests. Testing can be stopped between 65 and 70 years with 3 consecutive normal Pap tests and no abnormal Pap or HPV tests in the past 10 years.  HPV screening.** / Every 3 years from ages 64 years through ages 70 or 61 years with a history of 3 consecutive normal Pap tests. Testing can be stopped between 65 and 70 years with 3 consecutive normal Pap tests and no abnormal Pap or HPV tests in the past 10 years.  Fecal occult blood test (FOBT) of stool. / Every year beginning at age 40 years and continuing until age 27 years. You may not need to do this test if you get a colonoscopy every 10 years.  Flexible sigmoidoscopy or colonoscopy.** / Every 5 years for a flexible sigmoidoscopy or every 10 years for a colonoscopy beginning at age 7 years and continuing until age 32 years.  Hepatitis C blood test.** / For all people born from [birth year] through 1966 and any individual with known risks for hepatitis C.  Osteoporosis screening.** / A one-time screening for women ages 30 years and over and women at risk for fractures or osteoporosis.  Skin self-exam. / Monthly.  Influenza vaccine. / Every year.  Tetanus, diphtheria, and acellular pertussis (Tdap/Td)  vaccine.** / 1 dose of Td every 10 years.  Varicella vaccine.** / Consult your health care provider.  Zoster vaccine.** / 1 dose for adults aged 35 years or older.  Pneumococcal 13-valent conjugate (PCV13) vaccine.** / Consult your health care provider.  Pneumococcal polysaccharide (PPSV23) vaccine.** / 1 dose for all adults aged 46 years and older.  Meningococcal vaccine.** / Consult your health care provider.  Hepatitis A vaccine.** / Consult your health care provider.  Hepatitis B vaccine.** / Consult your health care provider.  Haemophilus influenzae type b (Hib) vaccine.** / Consult your health care provider. ** Family history and personal history of risk and conditions may change your health care provider's recommendations.   This information is not intended to replace advice given to you by your health care provider. Make sure you discuss any questions you have with your health care provider.   Document Released: 01/07/2002 Document Revised: 12/02/2014 Document Reviewed: 04/08/2011 Elsevier Interactive Patient Education [employer].

## 2016-04-08 NOTE — Progress Notes (Signed)
GYNECOLOGY CLINIC ANNUAL PREVENTATIVE CARE ENCOUNTER NOTE  Subjective:   Leah Dominguez is a 35 y.o. [phone] female here for a routine annual gynecologic exam.  Current complaints: chest pain and L knee pain.  Chest pain occurs more at rest, localized in center of chest, non-radiating, not associated with SOB. Intermittent, sometimes alleviated with antacids.  Knee pain started after working out, feels she has "torn something".  Denies abnormal vaginal bleeding, discharge, pelvic pain, problems with intercourse or other gynecologic concerns.    Gynecologic History Patient's last menstrual period was 04/03/2016. Contraception: abstinence Last Pap: 03/06/15. Results were: ASCUS pap smear and negative high-risk HPV    Obstetric History OB History  Gravida Para Term Preterm AB SAB TAB Ectopic Multiple Living  0 0 0 2    # Outcome Date GA Lbr Len/2nd Weight Sex Delivery Anes PTL Lv  7 Gravida              Comments: System Generated. Please review and update pregnancy details.  6 TAB           5 Term           4 Term           3 SAB           2 TAB           1 TAB               Past Medical History  Diagnosis Date  . Hypertension     PIH with last pregnancy  . Anxiety and depression   . IBS (irritable bowel syndrome)     Past Surgical History  Procedure Laterality Date  . Induced abortion  08/2011    Current Outpatient Prescriptions on File Prior to Visit  Medication Sig Dispense Refill  . traMADol (ULTRAM) 50 MG tablet Take 1 tablet (50 mg total) by mouth every 8 (eight) hours as needed. (Patient not taking: Reported on 04/08/2016) 5 tablet 0   No current facility-administered medications on file prior to visit.    No Known Allergies  Social History   Social History  . Marital Status: Single    Spouse Name: N/A  . Number of Children: 2  . Years of Education: N/A   Occupational History  . [employer]    Social History Main Topics  . Smoking  status: Never Smoker   . Smokeless tobacco: Never Used  . Alcohol Use: No  . Drug Use: No  . Sexual Activity:    Partners: Male    Birth Control/ Protection: None   Other Topics Concern  . Not on file   Social History Narrative    Family History  Problem Relation Age of Onset  . Anesthesia problems Neg Hx   . Diabetes Maternal Grandmother   . Diabetes Maternal Uncle     The following portions of the patient's history were reviewed and updated as appropriate: allergies, current medications, past family history, past medical history, past social history, past surgical history and problem list.  Review of Systems Pertinent items noted in HPI and remainder of comprehensive ROS otherwise negative.   Objective:  BP 111/76 mmHg  Pulse 75  Ht 6' (1.829 m)  Wt 227 lb 12.8 oz (103.329 kg)  BMI 30.89 kg/m2  LMP 05/10/2017CONSTITUTIONAL: Well-developed, well-nourished female in no acute distress.  HENT:  Normocephalic, atraumatic, External right and left ear normal. Oropharynx is clear and moist  EYES: Conjunctivae and EOM are normal. Pupils are equal, round, and reactive to light. No scleral icterus.  NECK: Normal range of motion, supple, no masses.  Normal thyroid.  SKIN: Skin is warm and dry. No rash noted. Not diaphoretic. No erythema. No pallor. NEUROLOGIC: Alert and oriented to person, place, and time. Normal reflexes, muscle tone coordination. No cranial nerve deficit noted. PSYCHIATRIC: Normal mood and affect. Normal behavior. Normal judgment and thought content. CARDIOVASCULAR: Normal heart rate noted, regular rhythm RESPIRATORY: Clear to auscultation bilaterally. Effort and breath sounds normal, no problems with respiration noted. BREASTS: Symmetric in size. No masses, skin changes, nipple drainage, or lymphadenopathy. ABDOMEN: Soft, normal bowel sounds, no distention noted.  No tenderness, rebound or guarding.  PELVIC: Normal appearing external genitalia; normal appearing  vaginal mucosa and cervix.  Foul-smelling yellow discharge noted.  Pap smear obtained, cervix noted be a mildly friable and there was a small amount of bleeding.  Normal uterine size, no other palpable masses, no uterine or adnexal tenderness. MUSCULOSKELETAL: Normal range of motion. No tenderness.  No cyanosis, clubbing, or edema.  2+ distal pulses.   Assessment:  Annual gynecologic examination with pap smear Chest and knee pain  Plan:  Will follow up results of pap smear and manage accordingly. Ancillary testing done due to abnormal discharge and bleeding during pap. Will follow up with PCP ([facility]) for further evaluation of chest and knee pain. Routine preventative health maintenance measures emphasized. Please refer to After Visit Summary for other counseling recommendations.    Jaynie CollinsUGONNA  Tobi Groesbeck, MD, FACOG Attending Obstetrician & Gynecologist, [facility]

## 2016-04-10 LAB — CYTOLOGY - PAP

## 2016-04-12 LAB — CERVICOVAGINAL ANCILLARY ONLY: CANDIDA VAGINITIS: NEGATIVE

## 2016-04-13 MED ORDER — METRONIDAZOLE 500 MG PO TABS: 500.0000 mg | ORAL_TABLET | Freq: Two times a day (BID) | ORAL | Status: DC

## 2016-04-13 NOTE — Addendum Note (Signed)
Addended by: Jaynie CollinsANYANWU, Lillie Portner A on: 04/13/2016 09:10 AM   Modules accepted: Orders

## 2016-10-22 ENCOUNTER — Encounter: Payer: Self-pay | Admitting: Emergency Medicine

## 2016-10-22 DIAGNOSIS — Z79899 Other long term (current) drug therapy: Secondary | ICD-10-CM | POA: Diagnosis not present

## 2016-10-22 DIAGNOSIS — R1013 Epigastric pain: Secondary | ICD-10-CM | POA: Insufficient documentation

## 2016-10-22 DIAGNOSIS — I10 Essential (primary) hypertension: Secondary | ICD-10-CM | POA: Diagnosis not present

## 2016-10-22 NOTE — ED Triage Notes (Signed)
Patient ambulatory to triage with steady gait, without difficulty or distress noted; pt reports mid CP last several months, nonradiating, with no accomp symptoms; denies hx of same

## 2016-10-23 ENCOUNTER — Emergency Department
Admission: EM | Admit: 2016-10-23 | Discharge: 2016-10-23 | Disposition: A | Discharge status: Home / Self Care | Payer: Managed Care, Other (non HMO) | Attending: Emergency Medicine | Admitting: Emergency Medicine

## 2016-10-23 ENCOUNTER — Emergency Department: Payer: Managed Care, Other (non HMO)

## 2016-10-23 DIAGNOSIS — R1013 Epigastric pain: Secondary | ICD-10-CM

## 2016-10-23 LAB — CBC
HCT: 39.3 % (ref 35.0–47.0)
Hemoglobin: 13.7 g/dL (ref 12.0–16.0)
MCH: 29.7 pg (ref 26.0–34.0)
MCHC: 34.8 g/dL (ref 32.0–36.0)
MCV: 85.2 fL (ref 80.0–100.0)
Platelets: 208 10*3/uL (ref 150–440)
RBC: 4.61 MIL/uL (ref 3.80–5.20)
RDW: 13.1 % (ref 11.5–14.5)
WBC: 5.4 10*3/uL (ref 3.6–11.0)

## 2016-10-23 LAB — BASIC METABOLIC PANEL
ANION GAP: 7 (ref 5–15)
BUN: 14 mg/dL (ref 6–20)
CO2: 26 mmol/L (ref 22–32)
CREATININE: 0.8 mg/dL (ref 0.44–1.00)
Calcium: 9.4 mg/dL (ref 8.9–10.3)
Chloride: 105 mmol/L (ref 101–111)
GFR calc non Af Amer: >60 mL/min (ref >60)
Glucose, Bld: 106 mg/dL — ABNORMAL HIGH (ref 65–99)
Potassium: 3.2 mmol/L — ABNORMAL LOW (ref 3.5–5.1)
Sodium: 138 mmol/L (ref 135–145)

## 2016-10-23 LAB — URINALYSIS COMPLETE WITH MICROSCOPIC (ARMC ONLY)
BILIRUBIN URINE: NEGATIVE
Glucose, UA: NEGATIVE mg/dL
Leukocytes, UA: NEGATIVE
NITRITE: NEGATIVE
PROTEIN: 30 mg/dL — AB
Specific Gravity, Urine: 1.032 — ABNORMAL HIGH (ref 1.005–1.030)
pH: 5 (ref 5.0–8.0)

## 2016-10-23 LAB — POCT PREGNANCY, URINE: Preg Test, Ur: NEGATIVE

## 2016-10-23 LAB — TROPONIN I: Troponin I: <0.03 ng/mL (ref <0.03)

## 2016-10-23 MED ORDER — PANTOPRAZOLE SODIUM 40 MG PO TBEC: 40.0000 mg | DELAYED_RELEASE_TABLET | Freq: Every day | ORAL | 0 refills | Status: DC

## 2016-10-23 MED ORDER — GI COCKTAIL ~~LOC~~
30.0000 mL | Freq: Once | ORAL | Status: AC
  Administered 2016-10-23: 30 mL via ORAL
  Filled 2016-10-23: qty 30

## 2016-10-23 MED ORDER — PANTOPRAZOLE SODIUM 40 MG PO TBEC
40.0000 mg | DELAYED_RELEASE_TABLET | Freq: Once | ORAL | Status: AC
  Administered 2016-10-23: 40 mg via ORAL
  Filled 2016-10-23: qty 1

## 2016-10-23 NOTE — ED Provider Notes (Signed)
[facility] Emergency Department Provider Note   First MD Initiated Contact with Patient 10/23/16 [phone]     (approximate)  I have reviewed the triage vital signs and the nursing notes.   HISTORY  Chief Complaint Chest Pain   HPI Karie Chimeraita T Ripley is a 35 y.o. female presents with intermittent epigastric pain times several months (approximately 3 months). Patient denies any fever does admit to one episode of emesis today   Past Medical History:  Diagnosis Date  . Anxiety and depression   . Hypertension    PIH with last pregnancy  . IBS (irritable bowel syndrome)     Patient Active Problem List   Diagnosis Date Noted  . Infertility 09/20/2013  . Dysmenorrhea 05/03/2013  . Irritable bowel syndrome 04/29/2012  . B12 deficiency 03/27/2012  . Abdominal pain, chronic, left lower quadrant 02/18/2012  . Pelvic pain in female 12/12/2011  . Dysthymic disorder 10/31/2011  . Headache(784.0) 10/31/2011  . VAGINITIS NOS 05/05/2007    Past Surgical History:  Procedure Laterality Date  . INDUCED ABORTION  08/2011    Prior to Admission medications   Medication Sig Start Date End Date Taking? Authorizing Provider  metroNIDAZOLE (FLAGYL) 500 MG tablet Take 1 tablet (500 mg total) by mouth 2 (two) times daily. 04/13/16   Tereso NewcomerUgonna A Anyanwu, MD  traMADol (ULTRAM) 50 MG tablet Take 1 tablet (50 mg total) by mouth every 8 (eight) hours as needed. Patient not taking: Reported on 04/08/2016 01/27/14   Elenora GammaSamuel L Bradshaw, MD    Allergies Patient has no known allergies.  Family History  Problem Relation Age of Onset  . Diabetes Maternal Grandmother   . Diabetes Maternal Uncle   . Anesthesia problems Neg Hx     Social History Social History  Substance Use Topics  . Smoking status: Never Smoker  . Smokeless tobacco: Never Used  . Alcohol use No    Review of Systems Constitutional: No fever/chills Eyes: No visual changes. ENT: No sore throat. Cardiovascular:  Denies chest pain. Respiratory: Denies shortness of breath. Gastrointestinal: Positive for epigastric pain.  No nausea, no vomiting.  No diarrhea.  No constipation. Genitourinary: Negative for dysuria. Musculoskeletal: Negative for back pain. Skin: Negative for rash. Neurological: Negative for headaches, focal weakness or numbness.  10-point ROS otherwise negative.  ____________________________________________   PHYSICAL EXAM:  VITAL SIGNS: ED Triage Vitals  Enc Vitals Group     BP 10/22/16 2357 131/77     Pulse Rate 10/22/16 2357 84     Resp 10/22/16 2357 18     Temp 10/22/16 2357 98.5 F (36.9 C)     Temp Source 10/22/16 2357 Oral     SpO2 10/22/16 2357 99 %     Weight 10/22/16 2355 220 lb (99.8 kg)     Height 10/22/16 2355 6' (1.829 m)     Head Circumference --      Peak Flow --      Pain Score 10/22/16 2355 8     Pain Loc --      Pain Edu? --      Excl. in GC? --     Constitutional: Alert and oriented. Well appearing and in no acute distress. Eyes: Conjunctivae are normal. PERRL. EOMI. Head: Atraumatic. Mouth/Throat: Mucous membranes are moist.  Oropharynx non-erythematous. Neck: No stridor.  No meningeal signs.   Cardiovascular: Normal rate, regular rhythm. Good peripheral circulation. Grossly normal heart sounds. Respiratory: Normal respiratory effort.  No retractions. Lungs CTAB. Gastrointestinal: Positive for  epigastric discomfort. No distention.  Musculoskeletal: No lower extremity tenderness nor edema. No gross deformities of extremities. Neurologic:  Normal speech and language. No gross focal neurologic deficits are appreciated.  Skin:  Skin is warm, dry and intact. No rash noted. Psychiatric: Mood and affect are normal. Speech and behavior are normal.  ____________________________________________   LABS (all labs ordered are listed, but only abnormal results are displayed)  Labs Reviewed  BASIC METABOLIC PANEL - Abnormal; Notable for the following:         Result Value   Potassium 3.2 (*)    Glucose, Bld 106 (*)    All other components within normal limits  URINALYSIS COMPLETEWITH MICROSCOPIC ([facility] ONLY) - Abnormal; Notable for the following:    Color, Urine YELLOW (*)    APPearance CLEAR (*)    Ketones, ur TRACE (*)    Specific Gravity, Urine 1.032 (*)    Hgb urine dipstick 1+ (*)    Protein, ur 30 (*)    Bacteria, UA RARE (*)    Squamous Epithelial / LPF 0-5 (*)    All other components within normal limits  CBC  TROPONIN I  POCT PREGNANCY, URINE   ____________________________________________  EKG  ED ECG REPORT I, [place] N BROWN, the attending physician, personally viewed and interpreted this ECG.   Date: 10/23/2016  EKG Time: 12:01AM  Rate: 78  Rhythm: Normal Sinus Rhythm  Axis: Normal  Intervals:Normal  ST&T Change: None  ____________________________________________  RADIOLOGY I, [place] N BROWN, personally viewed and evaluated these images (plain radiographs) as part of my medical decision making, as well as reviewing the written report by the radiologist.  Dg Chest 2 View  Result Date: 10/23/2016 CLINICAL DATA:  Mid chest pain for several months EXAM: CHEST  2 VIEW COMPARISON:  03/07/2016 FINDINGS: The heart size and mediastinal contours are within normal limits. Both lungs are clear. The visualized skeletal structures are unremarkable. IMPRESSION: No active cardiopulmonary disease. Electronically Signed   By: Jasmine PangKim  Fujinaga M.D.   On: 10/23/2016 00:37     Procedures    ____________________________________________   INITIAL IMPRESSION / ASSESSMENT AND PLAN / ED COURSE  Pertinent labs & imaging results that were available during my care of the patient were reviewed by me and considered in my medical decision making (see chart for details).  History and physical exam consistent with possible gastric etiology for the patient's pain   Clinical Course      ____________________________________________  FINAL CLINICAL IMPRESSION(S) / ED DIAGNOSES  Final diagnoses:  Epigastric pain     MEDICATIONS GIVEN DURING THIS VISIT:  Medications  gi cocktail (Maalox,Lidocaine,Donnatal) (30 mLs Oral Given 10/23/16 0208)  pantoprazole (PROTONIX) EC tablet 40 mg (40 mg Oral Given 10/23/16 0216)     NEW OUTPATIENT MEDICATIONS STARTED DURING THIS VISIT:  New Prescriptions   No medications on file    Modified Medications   No medications on file    Discontinued Medications   No medications on file     Note:  This document was prepared using Dragon voice recognition software and may include unintentional dictation errors.    Darci Currentandolph N Brown, MD 10/23/16 [phone]

## 2016-10-25 ENCOUNTER — Telehealth: Payer: Self-pay | Admitting: Emergency Medicine

## 2016-10-25 NOTE — Telephone Encounter (Signed)
Ms Leah Dominguez called to say the GI physician she was referred to was "not on call" according to his office.  Per amion scheduling, there was no unassigned GI on call for visit date.  I advised patient to call [facility] in [place], as she lives in [place].  She will explain that there was no GI on call and ask if they can see her.

## 2016-12-18 ENCOUNTER — Ambulatory Visit (INDEPENDENT_AMBULATORY_CARE_PROVIDER_SITE_OTHER): Payer: Managed Care, Other (non HMO) | Admitting: Gastroenterology

## 2016-12-18 ENCOUNTER — Encounter: Payer: Self-pay | Admitting: Gastroenterology

## 2016-12-18 VITALS — BP 110/78 | HR 80 | Ht 71.0 in | Wt 227.8 lb

## 2016-12-18 DIAGNOSIS — K5909 Other constipation: Secondary | ICD-10-CM | POA: Diagnosis not present

## 2016-12-18 DIAGNOSIS — R1013 Epigastric pain: Secondary | ICD-10-CM

## 2016-12-18 DIAGNOSIS — Z8379 Family history of other diseases of the digestive system: Secondary | ICD-10-CM

## 2016-12-18 MED ORDER — PANTOPRAZOLE SODIUM 40 MG PO TBEC: 40.0000 mg | DELAYED_RELEASE_TABLET | Freq: Every day | ORAL | 6 refills | Status: AC

## 2016-12-18 MED ORDER — LINACLOTIDE 72 MCG PO CAPS: 72.0000 ug | ORAL_CAPSULE | Freq: Every day | ORAL | 3 refills | Status: AC

## 2016-12-18 NOTE — Patient Instructions (Addendum)
Take Linzess 72 mcg , 1 tablet daily.  You have been scheduled for an endoscopy. Please follow written instructions given to you at your visit today. If you use inhalers (even only as needed), please bring them with you on the day of your procedure. Your physician has requested that you go to www.startemmi.com and enter the access code given to you at your visit today. This web site gives a general overview about your procedure. However, you should still follow specific instructions given to you by our office regarding your preparation for the procedure. Continue Protonix, both prescriptions have been called in to pharmacy.

## 2016-12-18 NOTE — Progress Notes (Signed)
Leah Dominguez    [id number]    1981-03-19  Primary Care Physician:Hillary Vernie Ammons, MD  Referring Physician: Casey Burkitt, MD [address] [place], [place] [address]  Chief complaint:  Epigastric pain  HPI: 36 yr female remotely followed by Dr. Jarold Motto, was last seen in office in April 2013 is here to reestablish care with complaints of epigastric pain for past 2-3 months. She was seen in the ER last month , was started on Protonix, feels her symptoms are somewhat better.  No relation to diet or activity. Her daughter was recently diagnosed with Crohn's disease and had similar symptoms and she is worried if she has Crohn's disease.  No nausea, had 2 episodes of vomiting in the past 2 months prior to taking Protonix. Denies any odynophagia or dysphagia. No postprandial fullness.  On review of systems positive for constipation, has bowel movement once every 4-5 days, currently not on any laxatives. Denies any melena or blood per rectum.  No history of nsaids   Outpatient Encounter Prescriptions as of 12/18/2016  Medication Sig  . pantoprazole (PROTONIX) 40 MG tablet Take 1 tablet (40 mg total) by mouth daily.  . [DISCONTINUED] metroNIDAZOLE (FLAGYL) 500 MG tablet Take 1 tablet (500 mg total) by mouth 2 (two) times daily.  . [DISCONTINUED] traMADol (ULTRAM) 50 MG tablet Take 1 tablet (50 mg total) by mouth every 8 (eight) hours as needed. (Patient not taking: Reported on 04/08/2016)   No facility-administered encounter medications on file as of 12/18/2016.     Allergies as of 12/18/2016  . (No Known Allergies)    Past Medical History:  Diagnosis Date  . Anxiety and depression   . Hypertension    PIH with last pregnancy  . IBS (irritable bowel syndrome)     Past Surgical History:  Procedure Laterality Date  . INDUCED ABORTION  08/2011    Family History  Problem Relation Age of Onset  . Diabetes Maternal Grandmother   . Diabetes Maternal Uncle    . Anesthesia problems Neg Hx     Social History   Social History  . Marital status: Single    Spouse name: N/A  . Number of children: 2  . Years of education: N/A   Occupational History  . CUST SERV [employer]   Social History Main Topics  . Smoking status: Never Smoker  . Smokeless tobacco: Never Used  . Alcohol use No  . Drug use: No  . Sexual activity: Not Currently    Partners: Male    Birth control/ protection: None   Other Topics Concern  . Not on file   Social History Narrative  . No narrative on file      Review of systems: Review of Systems  Constitutional: Negative for fever and chills.  HENT: Negative.   Eyes: Negative for blurred vision.  Respiratory: Negative for cough, shortness of breath and wheezing.   Cardiovascular: Negative for chest pain and palpitations.  Gastrointestinal: as per HPI Genitourinary: Negative for dysuria, urgency, frequency and hematuria.  Musculoskeletal: Negative for myalgias, back pain and joint pain.  Skin: Negative for itching and rash.  Neurological: Negative for dizziness, tremors, focal weakness, seizures and loss of consciousness.  Endo/Heme/Allergies: Positive for seasonal allergies.  Psychiatric/Behavioral: Negative for depression, suicidal ideas and hallucinations. Positive for headaches All other systems reviewed and are negative.   Physical Exam: Vitals:   12/18/16 0843  BP: 110/78  Pulse: 80  Body mass index is 31.77 kg/m. Gen:      No acute distress HEENT:  EOMI, sclera anicteric Neck:     No masses; no thyromegaly Lungs:    Clear to auscultation bilaterally; normal respiratory effort CV:         Regular rate and rhythm; no murmurs Abd:      + bowel sounds; soft, non-tender; no palpable masses, no distension Ext:    No edema; adequate peripheral perfusion Skin:      Warm and dry; no rash Neuro: alert and oriented x 3 Psych: normal mood and affect  Data Reviewed:  Reviewed labs,  radiology imaging, old records and pertinent past GI work up   Assessment and Plan/Recommendations:  36 year old female here with complaints of epigastric abdominal pain She has family history of Crohn's disease We'll schedule for EGD for evaluation, differential includes possible gastritis, dyspepsia secondary to H. pylori, peptic ulcer disease The risks and benefits as well as alternatives of endoscopic procedure(s) have been discussed and reviewed. All questions answered. The patient agrees to proceed. Continue Protonix once daily Small frequent meals Avoid NSAID's  Constipation: Advised patient to increase dietary fiber and fluid intake Start Linzess 72 mcg daily, increase does if no response If continues to have persistent symptoms may consider anorectal manometry for further evaluation Colonoscopy 2013 was unremarkable but was limited due to poor prep  Greater than 50% of the time used for counseling as well as treatment plan and follow-up. She had multiple questions which were answered to her satisfaction  K. Scherry RanVeena Earnie Bechard , MD [phone] Mon-Fri 8a-5p [phone] after 5p, weekends, holidays  CC: Erlinda HongFitzgerald, Hillary Moe*

## 2016-12-19 ENCOUNTER — Encounter: Payer: Self-pay | Admitting: Gastroenterology

## 2016-12-19 ENCOUNTER — Ambulatory Visit (AMBULATORY_SURGERY_CENTER): Payer: Managed Care, Other (non HMO) | Admitting: Gastroenterology

## 2016-12-19 VITALS — BP 123/71 | HR 72 | Temp 98.4°F | Resp 16 | Ht 71.0 in | Wt 227.0 lb

## 2016-12-19 DIAGNOSIS — R1013 Epigastric pain: Secondary | ICD-10-CM | POA: Diagnosis not present

## 2016-12-19 MED ORDER — SODIUM CHLORIDE 0.9 % IV SOLN: 500.0000 mL | INTRAVENOUS | Status: AC

## 2016-12-19 NOTE — Op Note (Signed)
[facility] Patient Name: Leah Dominguez Procedure Date: 12/19/2016 11:35 AM MRN: [id number] Endoscopist: Napoleon Form , MD Age: 36 Referring MD:  Date of Birth: Sep 10, 1981 Gender: Female Account #: [id number] Procedure:                Upper GI endoscopy Indications:              Upper abdominal symptoms that persist despite an                            appropriate trial of therapy, Epigastric abdominal                            pain Medicines:                Monitored Anesthesia Care Procedure:                Pre-Anesthesia Assessment:                           - Prior to the procedure, a History and Physical                            was performed, and patient medications and                            allergies were reviewed. The patient's tolerance of                            previous anesthesia was also reviewed. The risks                            and benefits of the procedure and the sedation                            options and risks were discussed with the patient.                            All questions were answered, and informed consent                            was obtained. Prior Anticoagulants: The patient has                            taken no previous anticoagulant or antiplatelet                            agents. ASA Grade Assessment: II - A patient with                            mild systemic disease. After reviewing the risks                            and benefits, the patient was deemed in  satisfactory condition to undergo the procedure.                           After obtaining informed consent, the endoscope was                            passed under direct vision. Throughout the                            procedure, the patient's blood pressure, pulse, and                            oxygen saturations were monitored continuously. The                            Model GIF-[id number]) scope was  introduced                            through the mouth, and advanced to the second part                            of duodenum. The upper GI endoscopy was                            accomplished without difficulty. The patient                            tolerated the procedure well. Scope In: Scope Out: Findings:                 LA Grade C (one or more mucosal breaks continuous                            between tops of 2 or more mucosal folds, less than                            75% circumference) esophagitis with no bleeding was                            found 35 to 38 cm from the incisors.                           The stomach was normal.                           The examined duodenum was normal. Complications:            No immediate complications. Estimated Blood Loss:     Estimated blood loss: none. Impression:               - LA Grade C erosive esophagitis.                           - Normal stomach.                           -  Normal examined duodenum.                           - No specimens collected. Recommendation:           - Patient has a contact number available for                            emergencies. The signs and symptoms of potential                            delayed complications were discussed with the                            patient. Return to normal activities tomorrow.                            Written discharge instructions were provided to the                            patient.                           - Resume previous diet.                           - Continue present medications.                           - No aspirin, ibuprofen, naproxen, or other                            non-steroidal anti-inflammatory drugs.                           - No repeat upper endoscopy.                           - Perform an abdominal ultrasound at appointment to                            be scheduled. Napoleon Form, MD 12/19/2016 11:47:42 AM This report  has been signed electronically.

## 2016-12-19 NOTE — Patient Instructions (Signed)
Discharge instructions given. Handout on esophagitis. Resume previous medications. No aspirin,ibuprofen,naproxen,or other anti-inflammatory drugs. YOU HAD AN ENDOSCOPIC PROCEDURE TODAY AT THE [facility]:   Refer to the procedure report that was given to you for any specific questions about what was found during the examination.  If the procedure report does not answer your questions, please call your gastroenterologist to clarify.  If you requested that your care partner not be given the details of your procedure findings, then the procedure report has been included in a sealed envelope for you to review at your convenience later.  YOU SHOULD EXPECT: Some feelings of bloating in the abdomen. Passage of more gas than usual.  Walking can help get rid of the air that was put into your GI tract during the procedure and reduce the bloating. If you had a lower endoscopy (such as a colonoscopy or flexible sigmoidoscopy) you may notice spotting of blood in your stool or on the toilet paper. If you underwent a bowel prep for your procedure, you may not have a normal bowel movement for a few days.  Please Note:  You might notice some irritation and congestion in your nose or some drainage.  This is from the oxygen used during your procedure.  There is no need for concern and it should clear up in a day or so.  SYMPTOMS TO REPORT IMMEDIATELY:    Following upper endoscopy (EGD)  Vomiting of blood or coffee ground material  New chest pain or pain under the shoulder blades  Painful or persistently difficult swallowing  New shortness of breath  Fever of 100F or higher  Black, tarry-looking stools  For urgent or emergent issues, a gastroenterologist can be reached at any hour by calling [phone].   DIET:  We do recommend a small meal at first, but then you may proceed to your regular diet.  Drink plenty of fluids but you should avoid alcoholic beverages for 24 hours.  ACTIVITY:  You  should plan to take it easy for the rest of today and you should NOT DRIVE or use heavy machinery until tomorrow (because of the sedation medicines used during the test).    FOLLOW UP: Our staff will call the number listed on your records the next business day following your procedure to check on you and address any questions or concerns that you may have regarding the information given to you following your procedure. If we do not reach you, we will leave a message.  However, if you are feeling well and you are not experiencing any problems, there is no need to return our call.  We will assume that you have returned to your regular daily activities without incident.  If any biopsies were taken you will be contacted by phone or by letter within the next 1-3 weeks.  Please call us at [phone] if you have not heard about the biopsies in 3 weeks.    SIGNATURES/CONFIDENTIALITY: You and/or your care partner have signed paperwork which will be entered into your electronic medical record.  These signatures attest to the fact that that the information above on your After Visit Summary has been reviewed and is understood.  Full responsibility of the confidentiality of this discharge information lies with you and/or your care-partner.

## 2016-12-19 NOTE — Progress Notes (Signed)
Report to PACU, RN, vss, BBS= Clear.  

## 2016-12-20 ENCOUNTER — Telehealth: Payer: Self-pay | Admitting: *Deleted

## 2016-12-20 NOTE — Telephone Encounter (Signed)
  Follow up Call-  Call back number 12/19/2016  Post procedure Call Back phone  # #[phone] cell  Permission to leave phone message Yes  Some recent data might be hidden     Patient questions:  Do you have a fever, pain , or abdominal swelling? No. Pain Score  0 *  Have you tolerated food without any problems? Yes.    Have you been able to return to your normal activities? Yes.    Do you have any questions about your discharge instructions: Diet   No. Medications  No. Follow up visit  No.  Do you have questions or concerns about your Care? No.  Actions: * If pain score is 4 or above: No action needed, pain <4.

## 2016-12-24 ENCOUNTER — Other Ambulatory Visit: Payer: Self-pay

## 2016-12-24 DIAGNOSIS — R1013 Epigastric pain: Secondary | ICD-10-CM

## 2016-12-24 DIAGNOSIS — R1011 Right upper quadrant pain: Secondary | ICD-10-CM

## 2016-12-24 NOTE — Progress Notes (Signed)
[facility]16 10:45 arrival for 11.  She will need to be NPO after midnight     Patient notified of the appt date time

## 2016-12-30 ENCOUNTER — Ambulatory Visit (HOSPITAL_COMMUNITY): Payer: Managed Care, Other (non HMO)

## 2017-01-14 ENCOUNTER — Ambulatory Visit (HOSPITAL_COMMUNITY): Payer: Managed Care, Other (non HMO) | Attending: Gastroenterology

## 2017-06-16 ENCOUNTER — Other Ambulatory Visit (HOSPITAL_COMMUNITY)
Admission: RE | Admit: 2017-06-16 | Discharge: 2017-06-16 | Disposition: A | Discharge status: Home / Self Care | Payer: 59 | Source: Ambulatory Visit | Attending: Obstetrics & Gynecology | Admitting: Obstetrics & Gynecology

## 2017-06-16 ENCOUNTER — Ambulatory Visit (INDEPENDENT_AMBULATORY_CARE_PROVIDER_SITE_OTHER): Payer: 59 | Admitting: Obstetrics & Gynecology

## 2017-06-16 ENCOUNTER — Encounter: Payer: Self-pay | Admitting: Obstetrics & Gynecology

## 2017-06-16 VITALS — BP 120/76 | HR 75 | Ht 72.0 in | Wt 225.0 lb

## 2017-06-16 DIAGNOSIS — Z01419 Encounter for gynecological examination (general) (routine) without abnormal findings: Secondary | ICD-10-CM | POA: Diagnosis present

## 2017-06-16 DIAGNOSIS — Z113 Encounter for screening for infections with a predominantly sexual mode of transmission: Secondary | ICD-10-CM

## 2017-06-16 DIAGNOSIS — N979 Female infertility, unspecified: Secondary | ICD-10-CM

## 2017-06-16 MED ORDER — CLOMIPHENE CITRATE 50 MG PO TABS: 50.0000 mg | ORAL_TABLET | Freq: Every day | ORAL | 3 refills | Status: DC

## 2017-06-16 NOTE — Progress Notes (Signed)
GYNECOLOGY ANNUAL PREVENTATIVE CARE ENCOUNTER NOTE  Subjective:   Leah Dominguez is a 36 y.o. [phone] female here for a routine annual gynecologic exam.  Current complaints: secondary infertility. Has been trying for over a year. Regular menstrual cycles.  Has not used any aids. Desires Clomid prescription, was on this to conceive during alst pregnancy.   Desires full STI screen with pap smear today.  Denies abnormal vaginal bleeding, discharge, pelvic pain, problems with intercourse or other gynecologic concerns.    Gynecologic History Patient's last menstrual period was 06/12/2017 (exact date). Contraception: none Last Pap: 04/08/2016. Results were: Normal pap smear and negative high-risk HPV  Obstetric History OB History  Gravida Para Term Preterm AB Living  6 2 2  0 4 2  SAB TAB Ectopic Multiple Live Births  1 3 0 0 1    # Outcome Date GA Lbr Len/2nd Weight Sex Delivery Anes PTL Lv  6 Term 06/24/08    F Vag-Spont     5 Term 08/18/02    F Vag-Spont   LIV  4 TAB           3 SAB           2 TAB           1 TAB               Past Medical History:  Diagnosis Date  . Anxiety and depression    During pregnancy.   . Preeclampsia in postpartum period    PIH with last pregnancy    Past Surgical History:  Procedure Laterality Date  . COLONOSCOPY    . INDUCED ABORTION  08/2011    Current Outpatient Prescriptions on File Prior to Visit  Medication Sig Dispense Refill  . linaclotide (LINZESS) 72 MCG capsule Take 1 capsule (72 mcg total) by mouth daily before breakfast. (Patient not taking: Reported on 12/19/2016) 30 capsule 3  . pantoprazole (PROTONIX) 40 MG tablet Take 1 tablet (40 mg total) by mouth daily. 30 tablet 6   Current Facility-Administered Medications on File Prior to Visit  Medication Dose Route Frequency Provider Last Rate Last Dose  . 0.9 %  sodium chloride infusion  500 mL Intravenous Continuous Nandigam, Eleonore ChiquitoKavitha V, MD        No Known Allergies  Social  History   Social History  . Marital status: Single    Spouse name: N/A  . Number of children: 2  . Years of education: N/A   Occupational History  . [employer]     [employer]   Social History Main Topics  . Smoking status: Never Smoker  . Smokeless tobacco: Never Used  . Alcohol use No  . Drug use: No  . Sexual activity: Yes    Partners: Male    Birth control/ protection: None   Other Topics Concern  . Not on file   Social History Narrative  . No narrative on file    Family History  Problem Relation Age of Onset  . Diabetes Maternal Grandmother   . Breast cancer Maternal Grandmother 1455  . Diabetes Maternal Uncle   . Anesthesia problems Neg Hx   . Colon cancer Neg Hx   . Esophageal cancer Neg Hx   . Pancreatic cancer Neg Hx   . Rectal cancer Neg Hx   . Stomach cancer Neg Hx     The following portions of the patient's history were reviewed and updated as appropriate: allergies, current medications, past family history,  past medical history, past social history, past surgical history and problem list.  Review of Systems Pertinent items noted in HPI and remainder of comprehensive ROS otherwise negative.   Objective:  BP 120/76   Pulse 75   Ht 6' (1.829 m)   Wt 225 lb (102.1 kg)   LMP 06/12/2017 (Exact Date)   BMI 30.52 kg/m  CONSTITUTIONAL: Well-developed, well-nourished female in no acute distress.  HENT:  Normocephalic, atraumatic, External right and left ear normal. Oropharynx is clear and moist EYES: Conjunctivae and EOM are normal. Pupils are equal, round, and reactive to light. No scleral icterus.  NECK: Normal range of motion, supple, no masses.  Normal thyroid.  SKIN: Skin is warm and dry. No rash noted. Not diaphoretic. No erythema. No pallor. NEUROLOGIC: Alert and oriented to person, place, and time. Normal reflexes, muscle tone coordination. No cranial nerve deficit noted. PSYCHIATRIC: Normal mood and affect. Normal behavior. Normal judgment and thought  content. CARDIOVASCULAR: Normal heart rate noted, regular rhythm RESPIRATORY: Clear to auscultation bilaterally. Effort and breath sounds normal, no problems with respiration noted. BREASTS: Symmetric in size. No masses, skin changes, nipple drainage, or lymphadenopathy. ABDOMEN: Soft, normal bowel sounds, no distention noted.  No tenderness, rebound or guarding.  PELVIC: Normal appearing external genitalia; normal appearing vaginal mucosa and cervix.  No abnormal discharge noted.  Pap smear obtained.  Normal uterine size, no other palpable masses, no uterine or adnexal tenderness. MUSCULOSKELETAL: Normal range of motion. No tenderness.  No cyanosis, clubbing, or edema.  2+ distal pulses.   Assessment and Plan:  1. Infertility, female Discussed secondary infertility in detail, Clomid prescribed.  If this does not work, she will need other evaluation/referral to Infertility. Side effects of Clomid discussed in detail, all questions answered. Information given to her to review at home. Advised to take prenatal vitamins with folic acid, avoid teratogens. Use of ovulation predictor kits also recommended. - clomiPHENE (CLOMID) 50 MG tablet; Take 1 tablet (50 mg total) by mouth daily. Take on days 5-9 of your cycle  Dispense: 5 tablet; Refill: 3  2. Encounter for gynecological examination with Papanicolaou smear of cervix Will follow up results of pap smear, ancillary testing and lab tests and manage accordingly. - Cytology - PAP - HIV antibody - Hepatitis C antibody - Hepatitis B surface antigen - RPR Routine preventative health maintenance measures emphasized. Please refer to After Visit Summary for other counseling recommendations.    Jaynie Collins, MD, FACOG Attending Obstetrician & Gynecologist, [facility]

## 2017-06-16 NOTE — Patient Instructions (Addendum)
CLOMID PATIENT INSTRUCTIONS  WHY USE IT? Clomid helps your ovaries to release eggs (ovulate).  HOW TO USE IT? Clomid is taken as a pill usually on days 5,6,7,8, & 9 of your cycle.  Day 1 is the first day of your period. The dose or duration may be changed to achieve ovulation.  Provera (progesterone) may first be used to bring on a period for some patients. If you do not get pregnant this cycle, for your next cycles, take on days 1, 2, 3, 4 and 5. The day of ovulation on Clomid is usually between cycle day 14 and 17.  Having sexual intercourse at least every other day between cycle day 13 and 18 will improve your chances of becoming pregnant during the Clomid cycle.  You may monitor your ovulation using basal body temperature charts or with ovulation kits.  If using the ovulation predictor kits, having intercourse the day of the surge and the two days following is recommended. If you get your period, call when it starts for an appointment with your doctor, so that an exam may be done, and another Clomid cycle can be considered if appropriate. If you do not get a period by day 35 of the cycle, please get a blood pregnancy test.  If it is negative, speak to your doctor for instructions to bring on another period and to plan a follow-up appointment.  THINGS TO KNOW: If you get pregnant while using Clomid, your chance of twins is 7% and triplets is less than 1%. Some studies have suggested the use of "fertility drugs" may increase your risk of ovarian cancers in the future.  It is unclear if these drugs increase the risk, or people who have problems with fertility are prone for these cancers.  If there is an actual risk, it is very low.  If you have a history of liver problems or ovarian cancer, it may be wise to avoid this medication.  SIDE EFFECTS:  The most common side effect is hot flashes (20%).  Breast tenderness, headaches, nausea, bloating may also occur at different times.  Less than 3/1,000  people have dryness or loss of hair.  Persistent ovarian cysts may form from the use of this medication.  Ovarian hyperstimulation syndrome is a rare side effect at low doses.  Visual changes like flashes of light or blurring.    Preventive Care 18-39 Years, Female Preventive care refers to lifestyle choices and visits with your health care provider that can promote health and wellness. What does preventive care include?  A yearly physical exam. This is also called an annual well check.  Dental exams once or twice a year.  Routine eye exams. Ask your health care provider how often you should have your eyes checked.  Personal lifestyle choices, including: ? Daily care of your teeth and gums. ? Regular physical activity. ? Eating a healthy diet. ? Avoiding tobacco and drug use. ? Limiting alcohol use. ? Practicing safe sex. ? Taking vitamin and mineral supplements as recommended by your health care provider. What happens during an annual well check? The services and screenings done by your health care provider during your annual well check will depend on your age, overall health, lifestyle risk factors, and family history of disease. Counseling Your health care provider may ask you questions about your:  Alcohol use.  Tobacco use.  Drug use.  Emotional well-being.  Home and relationship well-being.  Sexual activity.  Eating habits.  Work and work Statistician.  Method of birth control.  Menstrual cycle.  Pregnancy history.  Screening You may have the following tests or measurements:  Height, weight, and BMI.  Diabetes screening. This is done by checking your blood sugar (glucose) after you have not eaten for a while (fasting).  Blood pressure.  Lipid and cholesterol levels. These may be checked every 5 years starting at age 67.  Skin check.  Hepatitis C blood test.  Hepatitis B blood test.  Sexually transmitted disease (STD)  testing.  BRCA-related cancer screening. This may be done if you have a family history of breast, ovarian, tubal, or peritoneal cancers.  Pelvic exam and Pap test. This may be done every 3 years starting at age 55. Starting at age 42, this may be done every 5 years if you have a Pap test in combination with an HPV test.  Discuss your test results, treatment options, and if necessary, the need for more tests with your health care provider. Vaccines Your health care provider may recommend certain vaccines, such as:  Influenza vaccine. This is recommended every year.  Tetanus, diphtheria, and acellular pertussis (Tdap, Td) vaccine. You may need a Td booster every 10 years.  Varicella vaccine. You may need this if you have not been vaccinated.  HPV vaccine. If you are 65 or younger, you may need three doses over 6 months.  Measles, mumps, and rubella (MMR) vaccine. You may need at least one dose of MMR. You may also need a second dose.  Pneumococcal 13-valent conjugate (PCV13) vaccine. You may need this if you have certain conditions and were not previously vaccinated.  Pneumococcal polysaccharide (PPSV23) vaccine. You may need one or two doses if you smoke cigarettes or if you have certain conditions.  Meningococcal vaccine. One dose is recommended if you are age 5-21 years and a first-year college student living in a residence hall, or if you have one of several medical conditions. You may also need additional booster doses.  Hepatitis A vaccine. You may need this if you have certain conditions or if you travel or work in places where you may be exposed to hepatitis A.  Hepatitis B vaccine. You may need this if you have certain conditions or if you travel or work in places where you may be exposed to hepatitis B.  Haemophilus influenzae type b (Hib) vaccine. You may need this if you have certain risk factors.  Talk to your health care provider about which screenings and vaccines you  need and how often you need them. This information is not intended to replace advice given to you by your health care provider. Make sure you discuss any questions you have with your health care provider. Document Released: 01/07/2002 Document Revised: 07/31/2016 Document Reviewed: 09/12/2015 Elsevier Interactive Patient Education  2017 [employer].

## 2017-06-19 LAB — CYTOLOGY - PAP
BACTERIAL VAGINITIS: NEGATIVE
CANDIDA VAGINITIS: NEGATIVE
Chlamydia: NEGATIVE
DIAGNOSIS: NEGATIVE
HPV (WINDOPATH): NOT DETECTED
Trichomonas: NEGATIVE

## 2017-06-19 LAB — HIV ANTIBODY (ROUTINE TESTING W REFLEX): HIV SCREEN 4TH GENERATION: NONREACTIVE

## 2017-06-19 LAB — HEPATITIS B SURFACE ANTIGEN

## 2017-06-19 LAB — RPR: RPR: NONREACTIVE

## 2017-06-19 LAB — HEPATITIS C ANTIBODY

## 2017-11-05 ENCOUNTER — Telehealth: Payer: Self-pay | Admitting: *Deleted

## 2017-11-05 NOTE — Telephone Encounter (Signed)
-----   Message from Lindell SparHeather L Bacon, [place] sent at 11/04/2017  2:26 PM EST ----- Regarding: patient requesting a call back Contact: [phone] Please call patient back has a question about being seen after taking medication

## 2017-11-05 NOTE — Telephone Encounter (Signed)
Called pt - call went straight to voicemail - LM for her to rtn call if needed

## 2018-02-16 ENCOUNTER — Ambulatory Visit: Payer: 59 | Admitting: Obstetrics & Gynecology

## 2018-02-16 DIAGNOSIS — Z09 Encounter for follow-up examination after completed treatment for conditions other than malignant neoplasm: Secondary | ICD-10-CM

## 2018-03-23 ENCOUNTER — Ambulatory Visit (INDEPENDENT_AMBULATORY_CARE_PROVIDER_SITE_OTHER): Payer: 59 | Admitting: Obstetrics & Gynecology

## 2018-03-23 ENCOUNTER — Encounter: Payer: Self-pay | Admitting: Obstetrics & Gynecology

## 2018-03-23 VITALS — BP 110/72 | HR 72 | Wt 215.4 lb

## 2018-03-23 DIAGNOSIS — N979 Female infertility, unspecified: Secondary | ICD-10-CM | POA: Diagnosis not present

## 2018-03-23 MED ORDER — LETROZOLE 2.5 MG PO TABS: 2.5000 mg | ORAL_TABLET | Freq: Every day | ORAL | 2 refills | Status: AC

## 2018-03-23 NOTE — Patient Instructions (Addendum)
Discussed Letrozole which has been shown to be superior to Clomid in ovulation induction in many cases.  Patient agreed to try this.  Letrozole prescribed, 2.5 mg po daily on days 3 to 7 following a spontaneous menses or progestin-induced bleed. If the cycle is ovulatory but pregnancy has not occurred, the same dose should be used in the next cycle. If ovulation does not occur, the dose should be increased to 5 mg/day cycle days 3 to 7, with a maximal dose of 7.5 mg/day.   Continue ovulation predictor kits and regular intercourse.        RE: MyChart  Dear Leah Dominguez  We are excited to introduce MyChart, a new best-in-class service that provides you online access to important information in your electronic medical record. We want to make it easier for you to view your health information - all in one secure location - when and where you need it. We expect MyChart will enhance the quality of care and service we provide. Use the activation code below to enroll in MyChart online at [email or web address]  When you register for MyChart, you can:  Marland Kitchen View your test results. . Communicate securely with your physician's office.  . View your medical history, allergies, medications, and immunizations. . Conveniently print information such as your medication lists.  If you are age 40 or older and want a member of your family to have access to your record, you must provide written consent by completing a proxy form available at our facility. Please speak to our clinical staff about guidelines regarding accounts for patients younger than age 47.  As you activate your MyChart account and need any technical assistance, please call the MyChart technical support line at [phone]) [phone]).  Thank you for using MyChart as your new health and wellness resource!  MyChart Activation Code:  Activation code not generated Current MyChart Status: Active           [facility]  [address] [place], [place] [address]

## 2018-03-23 NOTE — Progress Notes (Signed)
   GYNECOLOGY OFFICE VISIT NOTE  History:  37 y.o. [id number] here today for follow up for infertility. Was started on Clomid last year, took 50 mg dose for a few months without success. No follow up since then. Still has regular menstrual periods.  She denies any abnormal vaginal discharge, bleeding, pelvic pain or other concerns.   Past Medical History:  Diagnosis Date  . Anxiety and depression    During pregnancy.   . Preeclampsia in postpartum period    PIH with last pregnancy    Past Surgical History:  Procedure Laterality Date  . COLONOSCOPY    . INDUCED ABORTION  08/2011    The following portions of the patient's history were reviewed and updated as appropriate: allergies, current medications, past family history, past medical history, past social history, past surgical history and problem list.   Health Maintenance:  Normal pap and negative HRHPV on 06/16/2017.    Review of Systems:  Pertinent items noted in HPI and remainder of comprehensive ROS otherwise negative.  Objective:  Physical Exam BP 110/72   Pulse 72   Wt 215 lb 6.4 oz (97.7 kg)   LMP 03/21/2018   BMI 29.21 kg/m  CONSTITUTIONAL: Well-developed, well-nourished female in no acute distress.  HENT:  Normocephalic, atraumatic. External right and left ear normal.  EYES: Conjunctivae and EOM are normal. Pupils are equal, round, and reactive to light. No scleral icterus.  NECK: Normal range of motion, supple, no masses SKIN: Skin is warm and dry. No rash noted. Not diaphoretic. No erythema. No pallor. NEUROLOGIC: Alert and oriented to person, place, and time. Normal reflexes, muscle tone coordination. No cranial nerve deficit noted. PSYCHIATRIC: Normal mood and affect. Normal behavior. Normal judgment and thought content. CARDIOVASCULAR: Normal heart rate noted RESPIRATORY: Effort and breath sounds normal, no problems with respiration noted ABDOMEN: Soft, no distention noted.   PELVIC: Deferred MUSCULOSKELETAL:  Normal range of motion. No edema noted.  Labs and Imaging No results found.  Assessment & Plan:  1. Infertility, female Discussed Letrozole which has been shown to be superior to Clomid in ovulation induction in many cases.  Patient agreed to try this.  Letrozole prescribed, 2.5 mg po daily on days 3 to 7 following a spontaneous menses or progestin-induced bleed. If the cycle is ovulatory but pregnancy has not occurred, the same dose should be used in the next cycle. If ovulation does not occur, the dose should be increased to 5 mg/day cycle days 3 to 7, with a maximal dose of 7.5 mg/day.   Continue ovulation predictor kits and regular intercourse. She will contact us via MyChart in two months to increase dosage if needed.  - letrozole (FEMARA) 2.5 MG tablet; Take 1 tablet (2.5 mg total) by mouth daily. Take on days 3 to 7 following a spontaneous menses or progestin-induced bleed.  Dispense: 5 tablet; Refill: 2 Routine preventative health maintenance measures emphasized. Please refer to After Visit Summary for other counseling recommendations.   Return in about 6 months (around 09/22/2018) for Infertility follow up.  Total face-to-face time with patient: 15 minutes.  Over 50% of encounter was spent on counseling and coordination of care.   Jaynie Collins, MD, FACOG Obstetrician & Gynecologist, [facility] for [employer], [facility]

## 2018-04-08 ENCOUNTER — Encounter: Payer: Self-pay | Admitting: Radiology

## 2018-06-15 ENCOUNTER — Telehealth: Payer: Self-pay

## 2018-06-15 NOTE — Telephone Encounter (Signed)
Received FMLA paper work for patient.  Producer, television/film/videolmtcb

## 2018-06-18 ENCOUNTER — Encounter: Payer: Self-pay | Admitting: Obstetrics & Gynecology

## 2018-08-20 ENCOUNTER — Encounter: Payer: Self-pay | Admitting: Radiology
